# Patient Record
Sex: Female | Born: 1980 | Race: White | Hispanic: No | Smoking: Former smoker
Health system: Southern US, Community
[De-identification: ages and names within clinical notes are randomized; demographics above are authoritative.]

## PROBLEM LIST (undated history)

## (undated) ENCOUNTER — Inpatient Hospital Stay (HOSPITAL_COMMUNITY): Payer: Self-pay

## (undated) DIAGNOSIS — F32A Depression, unspecified: Secondary | ICD-10-CM

## (undated) DIAGNOSIS — Z9079 Acquired absence of other genital organ(s): Secondary | ICD-10-CM

## (undated) DIAGNOSIS — F329 Major depressive disorder, single episode, unspecified: Secondary | ICD-10-CM

## (undated) DIAGNOSIS — N979 Female infertility, unspecified: Secondary | ICD-10-CM

## (undated) DIAGNOSIS — E119 Type 2 diabetes mellitus without complications: Secondary | ICD-10-CM

## (undated) DIAGNOSIS — I10 Essential (primary) hypertension: Secondary | ICD-10-CM

## (undated) DIAGNOSIS — E282 Polycystic ovarian syndrome: Secondary | ICD-10-CM

## (undated) HISTORY — PX: BREAST SURGERY: SHX581

## (undated) HISTORY — DX: Essential (primary) hypertension: I10

## (undated) HISTORY — DX: Type 2 diabetes mellitus without complications: E11.9

## (undated) HISTORY — PX: APPENDECTOMY: SHX54

## (undated) HISTORY — DX: Acquired absence of other genital organ(s): Z90.79

---

## 2014-08-18 HISTORY — PX: GASTRIC BYPASS: SHX52

## 2017-03-09 ENCOUNTER — Other Ambulatory Visit (HOSPITAL_COMMUNITY)
Admission: RE | Admit: 2017-03-09 | Discharge: 2017-03-09 | Disposition: A | Discharge status: Home / Self Care | Payer: Medicaid Other | Source: Ambulatory Visit | Attending: Certified Nurse Midwife | Admitting: Certified Nurse Midwife

## 2017-03-09 ENCOUNTER — Ambulatory Visit (INDEPENDENT_AMBULATORY_CARE_PROVIDER_SITE_OTHER): Payer: Medicaid Other | Admitting: Certified Nurse Midwife

## 2017-03-09 ENCOUNTER — Encounter: Payer: Self-pay | Admitting: Certified Nurse Midwife

## 2017-03-09 VITALS — BP 113/65 | HR 78 | Ht 61.0 in | Wt 153.6 lb

## 2017-03-09 DIAGNOSIS — O24312 Unspecified pre-existing diabetes mellitus in pregnancy, second trimester: Secondary | ICD-10-CM | POA: Diagnosis not present

## 2017-03-09 DIAGNOSIS — O09812 Supervision of pregnancy resulting from assisted reproductive technology, second trimester: Secondary | ICD-10-CM

## 2017-03-09 DIAGNOSIS — O24319 Unspecified pre-existing diabetes mellitus in pregnancy, unspecified trimester: Secondary | ICD-10-CM | POA: Insufficient documentation

## 2017-03-09 DIAGNOSIS — O09811 Supervision of pregnancy resulting from assisted reproductive technology, first trimester: Secondary | ICD-10-CM | POA: Insufficient documentation

## 2017-03-09 DIAGNOSIS — O10919 Unspecified pre-existing hypertension complicating pregnancy, unspecified trimester: Secondary | ICD-10-CM

## 2017-03-09 DIAGNOSIS — O09519 Supervision of elderly primigravida, unspecified trimester: Secondary | ICD-10-CM

## 2017-03-09 DIAGNOSIS — O09511 Supervision of elderly primigravida, first trimester: Secondary | ICD-10-CM | POA: Insufficient documentation

## 2017-03-09 DIAGNOSIS — O30002 Twin pregnancy, unspecified number of placenta and unspecified number of amniotic sacs, second trimester: Secondary | ICD-10-CM

## 2017-03-09 DIAGNOSIS — O09512 Supervision of elderly primigravida, second trimester: Secondary | ICD-10-CM

## 2017-03-09 DIAGNOSIS — E282 Polycystic ovarian syndrome: Secondary | ICD-10-CM | POA: Diagnosis not present

## 2017-03-09 DIAGNOSIS — O99840 Bariatric surgery status complicating pregnancy, unspecified trimester: Secondary | ICD-10-CM | POA: Insufficient documentation

## 2017-03-09 DIAGNOSIS — O10912 Unspecified pre-existing hypertension complicating pregnancy, second trimester: Secondary | ICD-10-CM

## 2017-03-09 DIAGNOSIS — O30001 Twin pregnancy, unspecified number of placenta and unspecified number of amniotic sacs, first trimester: Secondary | ICD-10-CM

## 2017-03-09 DIAGNOSIS — O30009 Twin pregnancy, unspecified number of placenta and unspecified number of amniotic sacs, unspecified trimester: Secondary | ICD-10-CM | POA: Insufficient documentation

## 2017-03-09 DIAGNOSIS — O09819 Supervision of pregnancy resulting from assisted reproductive technology, unspecified trimester: Secondary | ICD-10-CM | POA: Insufficient documentation

## 2017-03-09 LAB — POCT URINALYSIS DIP (DEVICE)
Glucose, UA: NEGATIVE mg/dL
Nitrite: NEGATIVE
PH: 5.5 (ref 5.0–8.0)
Protein, ur: 30 mg/dL — AB
Urobilinogen, UA: 0.2 mg/dL (ref 0.0–1.0)

## 2017-03-09 LAB — CYSTIC FIBROSIS DIAGNOSTIC STUDY: INTERPRETATION-CFDNA: NEGATIVE

## 2017-03-09 NOTE — Progress Notes (Signed)
Subjective:  Gina Henderson is a 36 y.o. G1P0000 at 20w1dbeing seen today for ongoing prenatal care.  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  Patient reports no complaints.  Contractions: Not present. Vag. Bleeding: None.  Movement: Absent. Denies leaking of fluid.   The following portions of the patient's history were reviewed and updated as appropriate: allergies, current medications, past family history, past medical history, past social history, past surgical history and problem list. Problem list updated.  Objective:   Vitals:   03/09/17 1313 03/09/17 1319  BP: 113/65   Pulse: 78   Weight: 153 lb 9.6 oz (69.7 kg)   Height:  5' 1"  (1.549 m)    Fetal Status: Fetal Heart Rate (bpm): 139/154 Fundal Height: 18 cm Movement: Absent     General:  Alert, oriented and cooperative. Patient is in no acute distress.  Breast: bilateral breasts normal in appearance. No erythema, deformity, or nipple discharge. No palpable abnormal masses. No axillary lymphadenopathy.  Skin: Skin is warm and dry. No rash noted.   Cardiovascular: Normal heart rate noted  Respiratory: Normal respiratory effort, no problems with respiration noted  Abdomen: Soft, gravid, appropriate for gestational age. Pain/Pressure: Present     Pelvic: Vag. Bleeding: None  Normal introitus for age, no external lesions, mild vaginal discharge, mucosa pink and moist, no vaginal or cervical lesions, no vaginal atrophy, no friaility or hemorrhage, no  adnexal masses or tenderness Cervical exam deferred        Extremities: Normal range of motion.  Edema: None  Mental Status: Normal mood and affect. Normal behavior. Normal judgment and thought content.   Urinalysis:    Urine protein 30  Assessment and Plan:  Pregnancy: G1P0000 at 154w1d  1. Encounter for supervision of high risk primigravida of advanced maternal age Initial prenatal labs collected. Pap smear performed. Genetic counseling offered and patient wants testing. Orders placed for NIPS.   2. Pregnancy resulting from assisted reproductive technology in first trimester H/o left salpingectomy and obstruction of other fallopian tube. Received IVF with transfer of two embryos on May 11th, 2018. Requesting records from office.  - Hemoglobin A1c - Cystic Fibrosis Mutation 97 - Culture, OB Urine - Obstetric Panel, Including HIV - [place] OB DETAIL +14 WK; Future - Comp Met (CMET) - [place] Fetal Nuchal Translucency; Future  3. AMA (advanced maternal age) primigravida 357+first trimester Genetic counseling offered.  - Hemoglobin A1c - Cytology - PAP - Cystic Fibrosis Mutation 97 - Culture, OB Urine - Obstetric Panel, Including HIV - [place] OB DETAIL +14 WK; Future - Comp Met (CMET) - [place] Fetal Nuchal Translucency; Future  4. Chronic hypertension during pregnancy, antepartum Will check renal function and P/C ratio. Will hold off on initiating ASA in setting of gastric bypass. BPs well controlled currently. Not on medications.   5. Pregestational diabetes mellitus, modified White class B Pre-gestational DM that was controlled on medications. Patient currently no longer taking metformin due to intolerance. Collect A1c. Encouraged patient to record her CBGs daily x4. Hold off on nutritionist at this time.  6. PCOS (polycystic ovarian syndrome)  7.  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  8. Twin gestation  in first trimester, unspecified multiple gestation type Most likely di/di. Requesting records. [place] ordered.    Preterm labor symptoms and general obstetric precautions including but not limited to vaginal bleeding, contractions, leaking of fluid and fetal movement were reviewed in detail with the patient. Please refer to After Visit Summary for other counseling recommendations.  Return in about 4 weeks (around 04/06/2017) for ob visit.  Luiz Blare, DO [facility], [place] 03/09/2017, 2:29 PM

## 2017-03-10 ENCOUNTER — Ambulatory Visit: Payer: Medicaid Other

## 2017-03-10 DIAGNOSIS — Z0189 Encounter for other specified special examinations: Secondary | ICD-10-CM

## 2017-03-10 LAB — PROTEIN / CREATININE RATIO, URINE
Creatinine, Urine: 231 mg/dL
PROTEIN/CREAT RATIO: 127 mg/g{creat} (ref 0–200)
Protein, Ur: 29.3 mg/dL

## 2017-03-10 NOTE — Progress Notes (Signed)
Late entry; called patient on 03/09/17 to have her come in for NIPS. Patient stated that she would come back in on 7/23 or 7/24. Patient had no questions.Patient verbalized  Understanding.Patient presented to office on 7/24 to have labs drawn for [place]or[facility] . Patient tolerated well.

## 2017-03-11 LAB — CULTURE, OB URINE

## 2017-03-11 LAB — CYTOLOGY - PAP
Candida vaginitis: NEGATIVE
Chlamydia: NEGATIVE
DIAGNOSIS: NEGATIVE
HPV: NOT DETECTED
NEISSERIA GONORRHEA: NEGATIVE
Trichomonas: NEGATIVE

## 2017-03-11 LAB — URINE CULTURE, OB REFLEX

## 2017-03-12 ENCOUNTER — Other Ambulatory Visit: Payer: Self-pay

## 2017-03-12 DIAGNOSIS — O09511 Supervision of elderly primigravida, first trimester: Secondary | ICD-10-CM

## 2017-03-13 ENCOUNTER — Ambulatory Visit (HOSPITAL_COMMUNITY)
Admission: RE | Admit: 2017-03-13 | Discharge: 2017-03-13 | Disposition: A | Discharge status: Home / Self Care | Payer: Medicaid Other | Source: Ambulatory Visit | Attending: Certified Nurse Midwife | Admitting: Certified Nurse Midwife

## 2017-03-13 ENCOUNTER — Other Ambulatory Visit: Payer: Self-pay | Admitting: Certified Nurse Midwife

## 2017-03-13 ENCOUNTER — Ambulatory Visit (HOSPITAL_COMMUNITY): Admission: RE | Admit: 2017-03-13 | Payer: Medicaid Other | Source: Ambulatory Visit

## 2017-03-13 ENCOUNTER — Encounter (HOSPITAL_COMMUNITY): Payer: Self-pay

## 2017-03-13 ENCOUNTER — Encounter: Payer: Self-pay | Admitting: *Deleted

## 2017-03-13 DIAGNOSIS — O09511 Supervision of elderly primigravida, first trimester: Secondary | ICD-10-CM

## 2017-03-13 DIAGNOSIS — O10011 Pre-existing essential hypertension complicating pregnancy, first trimester: Secondary | ICD-10-CM | POA: Insufficient documentation

## 2017-03-13 DIAGNOSIS — O99841 Bariatric surgery status complicating pregnancy, first trimester: Secondary | ICD-10-CM | POA: Insufficient documentation

## 2017-03-13 DIAGNOSIS — O30041 Twin pregnancy, dichorionic/diamniotic, first trimester: Secondary | ICD-10-CM | POA: Diagnosis present

## 2017-03-13 DIAGNOSIS — O30001 Twin pregnancy, unspecified number of placenta and unspecified number of amniotic sacs, first trimester: Secondary | ICD-10-CM

## 2017-03-13 DIAGNOSIS — O09811 Supervision of pregnancy resulting from assisted reproductive technology, first trimester: Secondary | ICD-10-CM | POA: Insufficient documentation

## 2017-03-13 DIAGNOSIS — Z3682 Encounter for antenatal screening for nuchal translucency: Secondary | ICD-10-CM

## 2017-03-13 DIAGNOSIS — Z3A13 13 weeks gestation of pregnancy: Secondary | ICD-10-CM

## 2017-03-13 DIAGNOSIS — O09512 Supervision of elderly primigravida, second trimester: Secondary | ICD-10-CM

## 2017-03-13 DIAGNOSIS — O24111 Pre-existing diabetes mellitus, type 2, in pregnancy, first trimester: Secondary | ICD-10-CM | POA: Insufficient documentation

## 2017-03-13 NOTE — Progress Notes (Signed)
Genetic Counseling  High-Risk Gestation Note  Appointment Date:  03/13/2017 Referred By: Gina Henderson, Gina Henderson, CNM Date of Birth:  01/20/1981   Pregnancy History: G1P0000 Estimated Date of Delivery: 09/13/17 Estimated Gestational Age: 5134w5d Attending: Alpha GulaPaul Whitecar, MD  [name] was seen for genetic counseling because of a maternal age of 36 years old at delivery in a dichorionic/diamniotic twin gestation.      In summary:  Discussed AMA and associated risk for fetal aneuploidy  Discussed options for screening  First screen-declined  Quad screen- declined  [facility]  Ultrasound- performed today; see separate ultrasound report  Discussed diagnostic testing options  Amniocentesis- declined  Reviewed family history concerns  Discussed carrier screening options- [place] [place] within normal limits  CF  SMA  Hemoglobinopathies  The current pregnancy [facility] The patient reported that she declined preimplantation genetic screening and two embryos were transferred. She was counseled regarding maternal age and the association with risk for chromosome conditions due to nondisjunction with aging of the ova.   We reviewed chromosomes, nondisjunction, and the associated 1 in 8678 risk for fetal aneuploidy in either twin related to a maternal age of 36 years old at delivery.  She was counseled that the risk for aneuploidy decreases as gestational age increases, accounting for those pregnancies which spontaneously abort.  We specifically discussed Down syndrome (trisomy 621), trisomies 4213 and 1318, and sex chromosome aneuploidies (47,XXX and 47,XXY) including the common features and prognoses of each.   We reviewed available screening options including First Screen, Quad screen, noninvasive prenatal  screening (NIPS)/cell free DNA (cfDNA) screening, and detailed ultrasound.  She was counseled that screening tests are used to modify a patient's a priori risk for aneuploidy, typically based on age. This estimate provides a pregnancy specific risk assessment. We reviewed the benefits and limitations of each option. Specifically, we discussed the conditions for which each test screens, the detection rates, and false positive rates of each. She was also counseled regarding diagnostic testing via [facility] and amniocentesis. We reviewed the approximate 1 in 300-500 risk for complications from amniocentesis, including spontaneous pregnancy loss. We discussed the possible results that the tests might provide including: positive, negative, unanticipated, and no result.   [name] stated that NIPS was drawn through her OB provider on Tuesday, 03/10/17. These results are currently pending. Given this pending result, maternal serum screening (First trimester screening and Quad screening) were declined. The patient declined amniocentesis in pregnancy.   A nuchal translucency ultrasound was attempted today, but NT assessment was not able to be performed due to fetal position.  The report will be documented separately.  The patient would like to return for a detailed ultrasound at ~18+ weeks gestation.  This appointment was scheduled today. She understands that screening tests cannot rule out all birth defects or genetic syndromes. The patient was advised of this limitation and states she still does not want additional testing at this time.    Gina Henderson was provided with written information regarding cystic fibrosis (CF), spinal muscular atrophy (SMA) and hemoglobinopathies including the carrier frequency, availability of carrier screening and prenatal diagnosis if indicated.  In addition, we discussed that CF and hemoglobinopathies are routinely screened for as part of the Gina Henderson newborn screening panel. Medical  records received from Gina Henderson indicated that Ms. Henderson had expanded carrier screening ([facility] through  [employer]), which was within normal limits for the conditions screened, including SMA, CF, and hemoglobinopathies. See separate lab report in medical records scanned under Media for complete list of conditions screened. Thus, her risk to be a carrier for each of these conditions has been significantly reduced.   Both family histories were reviewed and found to be noncontributory for birth defects, intellectual disability, and known genetic conditions. Consanguinity was denied. Without further information regarding the provided family history, an accurate genetic risk cannot be calculated. Further genetic counseling is warranted if more information is obtained.  [name] denied exposure to environmental toxins or chemical agents. She denied the use of alcohol, tobacco or street drugs. She denied significant viral illnesses during the course of her pregnancy. Her medical and surgical histories were contributory for history of gastric bypass surgery.   I counseled [name] regarding the above risks and available options.  The approximate face-to-face time with the genetic counselor was 30 minutes.  Gina PlowmanKaren Glenville Espina, MS,  Certified [employer] 03/13/2017

## 2017-03-16 LAB — HEMOGLOBIN A1C
ESTIMATED AVERAGE GLUCOSE: 111 mg/dL
Hgb A1c MFr Bld: 5.5 % (ref 4.8–5.6)

## 2017-03-16 LAB — OBSTETRIC PANEL, INCLUDING HIV
ANTIBODY SCREEN: NEGATIVE
BASOS: 0 %
Basophils Absolute: 0 10*3/uL (ref 0.0–0.2)
EOS (ABSOLUTE): 0.1 10*3/uL (ref 0.0–0.4)
Eos: 0 %
HEMOGLOBIN: 10.6 g/dL — AB (ref 11.1–15.9)
HIV SCREEN 4TH GENERATION: NONREACTIVE
Hematocrit: 32.1 % — ABNORMAL LOW (ref 34.0–46.6)
Hepatitis B Surface Ag: NEGATIVE
IMMATURE GRANULOCYTES: 0 %
Immature Grans (Abs): 0 10*3/uL (ref 0.0–0.1)
LYMPHS ABS: 1.8 10*3/uL (ref 0.7–3.1)
Lymphs: 16 %
MCH: 27.4 pg (ref 26.6–33.0)
MCHC: 33 g/dL (ref 31.5–35.7)
MCV: 83 fL (ref 79–97)
MONOS ABS: 0.6 10*3/uL (ref 0.1–0.9)
Monocytes: 5 %
NEUTROS ABS: 9.1 10*3/uL — AB (ref 1.4–7.0)
NEUTROS PCT: 79 %
Platelets: 208 10*3/uL (ref 150–379)
RBC: 3.87 x10E6/uL (ref 3.77–5.28)
RDW: 16 % — ABNORMAL HIGH (ref 12.3–15.4)
RH TYPE: POSITIVE
RPR Ser Ql: NONREACTIVE
Rubella Antibodies, IGG: 8.26 index (ref >0.99)
WBC: 11.6 10*3/uL — AB (ref 3.4–10.8)

## 2017-03-16 LAB — CYSTIC FIBROSIS MUTATION 97: Interpretation: NOT DETECTED

## 2017-03-20 ENCOUNTER — Encounter: Payer: Self-pay | Admitting: *Deleted

## 2017-04-02 LAB — COMPREHENSIVE METABOLIC PANEL
A/G RATIO: 1.5 (ref 1.2–2.2)
ALBUMIN: 4 g/dL (ref 3.5–5.5)
ALT: 8 IU/L (ref 0–32)
AST: 14 IU/L (ref 0–40)
Alkaline Phosphatase: 56 IU/L (ref 39–117)
BUN / CREAT RATIO: 17 (ref 9–23)
BUN: 9 mg/dL (ref 6–20)
Bilirubin Total: <0.2 mg/dL (ref 0.0–1.2)
CALCIUM: 9.1 mg/dL (ref 8.7–10.2)
CO2: 16 mmol/L — AB (ref 20–29)
CREATININE: 0.53 mg/dL — AB (ref 0.57–1.00)
Chloride: 103 mmol/L (ref 96–106)
GFR, EST AFRICAN AMERICAN: 138 mL/min/{1.73_m2} (ref >59)
GFR, EST NON AFRICAN AMERICAN: 120 mL/min/{1.73_m2} (ref >59)
GLOBULIN, TOTAL: 2.6 g/dL (ref 1.5–4.5)
Glucose: 74 mg/dL (ref 65–99)
POTASSIUM: 4.3 mmol/L (ref 3.5–5.2)
SODIUM: 136 mmol/L (ref 134–144)
Total Protein: 6.6 g/dL (ref 6.0–8.5)

## 2017-04-02 LAB — TSH: TSH: 0.063 u[IU]/mL — ABNORMAL LOW (ref 0.450–4.500)

## 2017-04-02 LAB — SPECIMEN STATUS REPORT

## 2017-04-03 ENCOUNTER — Encounter: Payer: Self-pay | Admitting: Certified Nurse Midwife

## 2017-04-03 DIAGNOSIS — R7989 Other specified abnormal findings of blood chemistry: Secondary | ICD-10-CM | POA: Insufficient documentation

## 2017-04-06 ENCOUNTER — Encounter: Payer: Self-pay | Admitting: General Practice

## 2017-04-06 ENCOUNTER — Ambulatory Visit (INDEPENDENT_AMBULATORY_CARE_PROVIDER_SITE_OTHER): Payer: Medicaid Other | Admitting: Family Medicine

## 2017-04-06 VITALS — BP 118/78 | HR 84 | Wt 157.0 lb

## 2017-04-06 DIAGNOSIS — O24312 Unspecified pre-existing diabetes mellitus in pregnancy, second trimester: Secondary | ICD-10-CM | POA: Diagnosis not present

## 2017-04-06 DIAGNOSIS — O24319 Unspecified pre-existing diabetes mellitus in pregnancy, unspecified trimester: Secondary | ICD-10-CM

## 2017-04-06 DIAGNOSIS — O10912 Unspecified pre-existing hypertension complicating pregnancy, second trimester: Secondary | ICD-10-CM | POA: Diagnosis not present

## 2017-04-06 DIAGNOSIS — R7989 Other specified abnormal findings of blood chemistry: Secondary | ICD-10-CM

## 2017-04-06 DIAGNOSIS — O09511 Supervision of elderly primigravida, first trimester: Secondary | ICD-10-CM

## 2017-04-06 DIAGNOSIS — Z9884 Bariatric surgery status: Secondary | ICD-10-CM

## 2017-04-06 DIAGNOSIS — O09812 Supervision of pregnancy resulting from assisted reproductive technology, second trimester: Secondary | ICD-10-CM

## 2017-04-06 DIAGNOSIS — O30001 Twin pregnancy, unspecified number of placenta and unspecified number of amniotic sacs, first trimester: Secondary | ICD-10-CM

## 2017-04-06 DIAGNOSIS — O09512 Supervision of elderly primigravida, second trimester: Secondary | ICD-10-CM | POA: Diagnosis present

## 2017-04-06 DIAGNOSIS — O10919 Unspecified pre-existing hypertension complicating pregnancy, unspecified trimester: Secondary | ICD-10-CM

## 2017-04-06 DIAGNOSIS — O09811 Supervision of pregnancy resulting from assisted reproductive technology, first trimester: Secondary | ICD-10-CM

## 2017-04-06 DIAGNOSIS — O09519 Supervision of elderly primigravida, unspecified trimester: Secondary | ICD-10-CM

## 2017-04-06 NOTE — Progress Notes (Signed)
   PRENATAL VISIT NOTE  Subjective:  Gina Henderson is a 36 y.o. G1P0000 at [redacted]w[redacted]d being seen today for ongoing prenatal care.  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  Patient reports round ligament pain.  Contractions: Not present. Vag. Bleeding: None.  Movement: Absent. Denies leaking of fluid.   DM: Patient diet controlled. Has not been on medication after gastric bypass. HgA1c 5.5. Fasting: 85-90 1hr PP: < 140   The following portions of the patient's history were reviewed and updated as appropriate: allergies, current medications, past family history, past medical history, past social history, past surgical history and problem list. Problem list updated.  Objective:   Vitals:   04/06/17 1257  BP: 118/78  Pulse: 84  Weight: 157 lb (71.2 kg)    Fetal Status: Fetal Heart Rate (bpm): 155/148   Movement: Absent     General:  Alert, oriented and cooperative. Patient is in no acute distress.  Skin: Skin is warm and dry. No rash noted.   Cardiovascular: Normal heart rate noted  Respiratory: Normal respiratory effort, no problems with respiration noted  Abdomen: Soft, gravid, appropriate for gestational age.  Pain/Pressure: Present     Pelvic: Cervical exam deferred        Extremities: Normal range of motion.  Edema: None  Mental Status:  Normal mood and affect. Normal behavior. Normal judgment and thought content.    Assessment and Plan:  Pregnancy: G1P0000 at [redacted]w[redacted]d  1. Encounter for supervision of high risk primigravida of advanced maternal age   2. Twin gestation in first trimester, unspecified multiple gestation type [place] next week  3. Chronic hypertension during pregnancy, antepartum Not on BP medication  4. AMA (advanced maternal age) primigravida 35+, first trimester No additional testing needed  5. Pregnancy resulting from assisted reproductive technology in first trimester  6. Pregestational diabetes mellitus, modified White class B Diet controlled.  7. H/O gastric bypass Sleeve  8. Abnormal thyroid blood test Check thyroid levels - T3, free - T4, free  Term labor symptoms and general obstetric precautions including but not limited to vaginal bleeding, contractions, leaking of fluid and fetal movement were reviewed in detail with the patient. Please refer to After Visit Summary for other counseling recommendations.  No Follow-up on file.   Levie Heritage, DO

## 2017-04-07 LAB — T3, FREE: T3 FREE: 2.6 pg/mL (ref 2.0–4.4)

## 2017-04-07 LAB — T4, FREE: FREE T4: 1.25 ng/dL (ref 0.82–1.77)

## 2017-04-13 ENCOUNTER — Other Ambulatory Visit: Payer: Self-pay | Admitting: Certified Nurse Midwife

## 2017-04-13 ENCOUNTER — Encounter: Payer: Self-pay | Admitting: Family Medicine

## 2017-04-13 ENCOUNTER — Encounter (HOSPITAL_COMMUNITY): Payer: Self-pay

## 2017-04-13 ENCOUNTER — Ambulatory Visit (HOSPITAL_COMMUNITY)
Admission: RE | Admit: 2017-04-13 | Discharge: 2017-04-13 | Disposition: A | Discharge status: Home / Self Care | Payer: Medicaid Other | Source: Ambulatory Visit | Attending: Certified Nurse Midwife | Admitting: Certified Nurse Midwife

## 2017-04-13 ENCOUNTER — Ambulatory Visit (HOSPITAL_COMMUNITY): Payer: Medicaid Other

## 2017-04-13 DIAGNOSIS — O09519 Supervision of elderly primigravida, unspecified trimester: Secondary | ICD-10-CM

## 2017-04-13 DIAGNOSIS — O30042 Twin pregnancy, dichorionic/diamniotic, second trimester: Secondary | ICD-10-CM | POA: Diagnosis present

## 2017-04-13 DIAGNOSIS — O99842 Bariatric surgery status complicating pregnancy, second trimester: Secondary | ICD-10-CM | POA: Diagnosis not present

## 2017-04-13 DIAGNOSIS — O09512 Supervision of elderly primigravida, second trimester: Secondary | ICD-10-CM | POA: Diagnosis not present

## 2017-04-13 DIAGNOSIS — O09812 Supervision of pregnancy resulting from assisted reproductive technology, second trimester: Secondary | ICD-10-CM | POA: Insufficient documentation

## 2017-04-13 DIAGNOSIS — Z3A18 18 weeks gestation of pregnancy: Secondary | ICD-10-CM

## 2017-04-13 DIAGNOSIS — O09811 Supervision of pregnancy resulting from assisted reproductive technology, first trimester: Secondary | ICD-10-CM

## 2017-04-13 DIAGNOSIS — O24912 Unspecified diabetes mellitus in pregnancy, second trimester: Secondary | ICD-10-CM | POA: Diagnosis not present

## 2017-04-13 DIAGNOSIS — O10012 Pre-existing essential hypertension complicating pregnancy, second trimester: Secondary | ICD-10-CM

## 2017-04-13 DIAGNOSIS — O09511 Supervision of elderly primigravida, first trimester: Secondary | ICD-10-CM

## 2017-04-14 ENCOUNTER — Telehealth: Payer: Self-pay | Admitting: General Practice

## 2017-04-14 ENCOUNTER — Other Ambulatory Visit (HOSPITAL_COMMUNITY): Payer: Self-pay | Admitting: *Deleted

## 2017-04-14 DIAGNOSIS — O30042 Twin pregnancy, dichorionic/diamniotic, second trimester: Secondary | ICD-10-CM

## 2017-04-14 NOTE — Addendum Note (Signed)
Encounter addended by: Donette Larry, CNM on: 04/14/2017  9:49 AM<BR>    Actions taken: Visit diagnoses modified, Problem List modified

## 2017-04-14 NOTE — Telephone Encounter (Signed)
Called patient and left message on VM of appointment change on 05/04/17 at 10:40am.  Asked patient to give our office a call if unable to keep appointment.

## 2017-04-27 ENCOUNTER — Inpatient Hospital Stay (HOSPITAL_COMMUNITY)
Admission: AD | Admit: 2017-04-27 | Discharge: 2017-04-27 | Disposition: A | Discharge status: Home / Self Care | Payer: Medicaid Other | Source: Ambulatory Visit | Attending: Obstetrics & Gynecology | Admitting: Obstetrics & Gynecology

## 2017-04-27 ENCOUNTER — Other Ambulatory Visit (HOSPITAL_COMMUNITY): Payer: Medicaid Other

## 2017-04-27 ENCOUNTER — Inpatient Hospital Stay (HOSPITAL_BASED_OUTPATIENT_CLINIC_OR_DEPARTMENT_OTHER)
Admit: 2017-04-27 | Discharge: 2017-04-27 | Disposition: A | Discharge status: Home / Self Care | Payer: Medicaid Other | Attending: Student | Admitting: Student

## 2017-04-27 ENCOUNTER — Encounter (HOSPITAL_COMMUNITY): Payer: Self-pay | Admitting: *Deleted

## 2017-04-27 DIAGNOSIS — Z87891 Personal history of nicotine dependence: Secondary | ICD-10-CM | POA: Insufficient documentation

## 2017-04-27 DIAGNOSIS — I8392 Asymptomatic varicose veins of left lower extremity: Secondary | ICD-10-CM | POA: Diagnosis not present

## 2017-04-27 DIAGNOSIS — M79662 Pain in left lower leg: Secondary | ICD-10-CM | POA: Diagnosis present

## 2017-04-27 DIAGNOSIS — O9989 Other specified diseases and conditions complicating pregnancy, childbirth and the puerperium: Secondary | ICD-10-CM | POA: Diagnosis not present

## 2017-04-27 DIAGNOSIS — Z3A20 20 weeks gestation of pregnancy: Secondary | ICD-10-CM | POA: Diagnosis not present

## 2017-04-27 DIAGNOSIS — O2202 Varicose veins of lower extremity in pregnancy, second trimester: Secondary | ICD-10-CM | POA: Insufficient documentation

## 2017-04-27 DIAGNOSIS — M7989 Other specified soft tissue disorders: Secondary | ICD-10-CM | POA: Diagnosis not present

## 2017-04-27 DIAGNOSIS — O99842 Bariatric surgery status complicating pregnancy, second trimester: Secondary | ICD-10-CM | POA: Diagnosis not present

## 2017-04-27 HISTORY — DX: Polycystic ovarian syndrome: E28.2

## 2017-04-27 HISTORY — DX: Depression, unspecified: F32.A

## 2017-04-27 HISTORY — DX: Female infertility, unspecified: N97.9

## 2017-04-27 HISTORY — DX: Major depressive disorder, single episode, unspecified: F32.9

## 2017-04-27 LAB — URINALYSIS, ROUTINE W REFLEX MICROSCOPIC
BILIRUBIN URINE: NEGATIVE
Glucose, UA: NEGATIVE mg/dL
Hgb urine dipstick: NEGATIVE
KETONES UR: NEGATIVE mg/dL
Nitrite: NEGATIVE
PROTEIN: NEGATIVE mg/dL
SPECIFIC GRAVITY, URINE: 1.013 (ref 1.005–1.030)
pH: 5 (ref 5.0–8.0)

## 2017-04-27 NOTE — MAU Provider Note (Signed)
History     CSN: [id number]  Arrival date and time: 04/27/17 [id number]  First Provider Initiated Contact with Patient 04/27/17 1012      Chief Complaint  Patient presents with  . Leg Pain  . Varicose Veins   HPI Gina Henderson is a 36 y.o. G1P0000 at 8523w2d with di/di twins who presents with leg pain r/t varicose veins. Reports her varicose veins have worsened during this pregnancy. Reports large painful area in back of left leg just above her knee. Rates pain 8/10 & describes as sore. Has not treated pain. Denies chest pain, cough, or SOB.  OB History    Gravida Para Term Preterm AB Living   1   0 0 0     SAB TAB Ectopic Multiple Live Births   0              Past Medical History:  Diagnosis Date  . Depression   . Diabetes mellitus without complication (HCC)    off meds since wt loss  . History of unilateral salpingectomy, left   . Hypertension    off meds since wt loss  . Infertility, female    PCOS  . PCOS (polycystic ovarian syndrome)     Past Surgical History:  Procedure Laterality Date  . APPENDECTOMY    . BREAST SURGERY    . GASTRIC BYPASS  2016    Family History  Problem Relation Age of Onset  . Diabetes Father   . Kidney disease Father   . Hypertension Father   . Hearing loss Father     Social History  Substance Use Topics  . Smoking status: Former Smoker    Types: Cigarettes  . Smokeless tobacco: Never Used     Comment: quit 2016  . Alcohol use No    Allergies: No Known Allergies  Prescriptions Prior to Admission  Medication Sig Dispense Refill Last Dose  . Prenatal Multivit-Min-Fe-FA (PRENATAL VITAMINS PO) Take by mouth.   Taking    Review of Systems  Constitutional: Negative.   Respiratory: Negative.   Cardiovascular: Positive for leg swelling. Negative for chest pain and palpitations.  Gastrointestinal: Negative.   Genitourinary: Negative.    Physical Exam   Blood pressure 119/82, pulse 85, temperature 98 F (36.7 C), temperature  source Oral, resp. rate 16, last menstrual period 11/30/2016, SpO2 99 %.  Physical Exam  Nursing note and vitals reviewed. Constitutional: She is oriented to person, place, and time. She appears well-developed and well-nourished. She appears distressed.  HENT:  Head: Normocephalic and atraumatic.  Eyes: Conjunctivae are normal. Right eye exhibits no discharge. Left eye exhibits no discharge. No scleral icterus.  Neck: Normal range of motion.  Cardiovascular: Normal rate, regular rhythm, normal heart sounds and intact distal pulses.   No murmur heard. Pulses:      Dorsalis pedis pulses are 2+ on the right side, and 2+ on the left side.  Respiratory: Effort normal and breath sounds normal. No respiratory distress. She has no wheezes.  GI: Soft. There is no tenderness.  Musculoskeletal:       Left lower leg: She exhibits tenderness.       Legs: Neurological: She is alert and oriented to person, place, and time.  Skin: Skin is warm and dry. She is not diaphoretic.  Psychiatric: She has a normal mood and affect. Her behavior is normal. Judgment and thought content normal.    MAU Course  Procedures Results for orders placed or performed during the hospital encounter of  04/27/17 (from the past 48 hour(s))  Urinalysis, Routine w reflex microscopic     Status: Abnormal   Collection Time: 04/27/17  9:36 AM  Result Value Ref Range   Color, Urine YELLOW YELLOW   APPearance CLOUDY (A) CLEAR   Specific Gravity, Urine 1.013 1.005 - 1.030   pH 5.0 5.0 - 8.0   Glucose, UA NEGATIVE NEGATIVE mg/dL   Hgb urine dipstick NEGATIVE NEGATIVE   Bilirubin Urine NEGATIVE NEGATIVE   Ketones, ur NEGATIVE NEGATIVE mg/dL   Protein, ur NEGATIVE NEGATIVE mg/dL   Nitrite NEGATIVE NEGATIVE   Leukocytes, UA LARGE (A) NEGATIVE   RBC / HPF 0-5 0 - 5 RBC/hpf   WBC, UA 6-30 0 - 5 WBC/hpf   Bacteria, UA RARE (A) NONE SEEN   Squamous Epithelial / LPF 6-30 (A) NONE SEEN   Mucus PRESENT    Hyaline Casts, UA PRESENT     No results found.  MDM FHT 159/145 VSS, NAD Venous doppler study; no evidence of DVT  Assessment and Plan  A: 1. Varicose veins of left lower extremity    P; Discharge home Rx maternity full length compression hose, 30 strength  Discussed reasons to return to MAU Keep f/u with ob  Judeth Horn 04/27/2017, 10:10 AM

## 2017-04-27 NOTE — MAU Note (Signed)
Pt has varicose veins on L leg, are very painful & swollen, have become worse since Thursday.  Pain goes all the way up from her ankle to her groin area.  C/O abd pain that has been ongoing, denies bleeding or LOF.  Pt very SOB in triage, subsided some with sitting.

## 2017-04-27 NOTE — Discharge Instructions (Signed)
Varicose Veins Varicose veins are veins that have become enlarged and twisted. They are usually seen in the legs but can occur in other parts of the body as well. What are the causes? This condition is the result of valves in the veins not working properly. Valves in the veins help to return blood from the leg to the heart. If these valves are damaged, blood flows backward and backs up into the veins in the leg near the skin. This causes the veins to become larger. What increases the risk? People who are on their feet a lot, who are pregnant, or who are overweight are more likely to develop varicose veins. What are the signs or symptoms?  Bulging, twisted-appearing, bluish veins, most commonly found on the legs.  Leg pain or a feeling of heaviness. These symptoms may be worse at the end of the day.  Leg swelling.  Changes in skin color. How is this diagnosed? A health care provider can usually diagnose varicose veins by examining your legs. Your health care provider may also recommend an ultrasound of your leg veins. How is this treated? Most varicose veins can be treated at home.However, other treatments are available for people who have persistent symptoms or want to improve the cosmetic appearance of the varicose veins. These treatment options include:  Sclerotherapy. A solution is injected into the vein to close it off.  Laser treatment. A laser is used to heat the vein to close it off.  Radiofrequency vein ablation. An electrical current produced by radio waves is used to close off the vein.  Phlebectomy. The vein is surgically removed through small incisions made over the varicose vein.  Vein ligation and stripping. The vein is surgically removed through incisions made over the varicose vein after the vein has been tied (ligated). Follow these instructions at home:   Do not stand or sit in one position for long periods of time. Do not sit with your legs crossed. Rest with your  legs raised during the day.  Wear compression stockings as directed by your health care provider. These stockings help to prevent blood clots and reduce swelling in your legs.  Do not wear other tight, encircling garments around your legs, pelvis, or waist.  Walk as much as possible to increase blood flow.  Raise the foot of your bed at night with 2-inch blocks.  If you get a cut in the skin over the vein and the vein bleeds, lie down with your leg raised and press on it with a clean cloth until the bleeding stops. Then place a bandage (dressing) on the cut. See your health care provider if it continues to bleed. Contact a health care provider if:  The skin around your ankle starts to break down.  You have pain, redness, tenderness, or hard swelling in your leg over a vein.  You are uncomfortable because of leg pain. This information is not intended to replace advice given to you by your health care provider. Make sure you discuss any questions you have with your health care provider. Document Released: 05/14/2005 Document Revised: 01/10/2016 Document Reviewed: 02/05/2016 Elsevier Interactive Patient Education  2017 [employer]  

## 2017-04-27 NOTE — Progress Notes (Signed)
*  PRELIMINARY RESULTS* Vascular Ultrasound Left lower extremity venous duplex has been completed.  Preliminary findings: no evidence of DVT. Superficial thrombosis noted in varicose veins of the left posterior leg.   Gave results to [place], RN and provider.   Farrel DemarkJill Eunice, RDMS, RVT  04/27/2017, 11:37 AM

## 2017-05-04 ENCOUNTER — Ambulatory Visit (INDEPENDENT_AMBULATORY_CARE_PROVIDER_SITE_OTHER): Payer: Medicaid Other | Admitting: Advanced Practice Midwife

## 2017-05-04 ENCOUNTER — Encounter: Payer: Medicaid Other | Admitting: Advanced Practice Midwife

## 2017-05-04 ENCOUNTER — Encounter: Payer: Self-pay | Admitting: Advanced Practice Midwife

## 2017-05-04 VITALS — BP 105/52 | HR 105 | Wt 166.6 lb

## 2017-05-04 DIAGNOSIS — Z23 Encounter for immunization: Secondary | ICD-10-CM | POA: Diagnosis present

## 2017-05-04 DIAGNOSIS — O30042 Twin pregnancy, dichorionic/diamniotic, second trimester: Secondary | ICD-10-CM

## 2017-05-04 DIAGNOSIS — R946 Abnormal results of thyroid function studies: Secondary | ICD-10-CM

## 2017-05-04 DIAGNOSIS — O24312 Unspecified pre-existing diabetes mellitus in pregnancy, second trimester: Secondary | ICD-10-CM | POA: Diagnosis not present

## 2017-05-04 DIAGNOSIS — O10919 Unspecified pre-existing hypertension complicating pregnancy, unspecified trimester: Secondary | ICD-10-CM

## 2017-05-04 DIAGNOSIS — O09519 Supervision of elderly primigravida, unspecified trimester: Secondary | ICD-10-CM

## 2017-05-04 DIAGNOSIS — O09512 Supervision of elderly primigravida, second trimester: Secondary | ICD-10-CM | POA: Diagnosis not present

## 2017-05-04 DIAGNOSIS — O24319 Unspecified pre-existing diabetes mellitus in pregnancy, unspecified trimester: Secondary | ICD-10-CM

## 2017-05-04 DIAGNOSIS — R7989 Other specified abnormal findings of blood chemistry: Secondary | ICD-10-CM

## 2017-05-04 DIAGNOSIS — O10912 Unspecified pre-existing hypertension complicating pregnancy, second trimester: Secondary | ICD-10-CM | POA: Diagnosis not present

## 2017-05-04 NOTE — Progress Notes (Signed)
   PRENATAL VISIT NOTE  Subjective:  Gina Henderson is a 36 y.o. G1P0000 at [redacted]w[redacted]d being seen today for ongoing prenatal care.  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  Patient reports trouble sleeping, anxiety, worried about babies.  Contractions: Not present. Vag. Bleeding: None.  Movement: Present. Denies leaking of fluid.   The following portions of the patient's history were reviewed and updated as appropriate: allergies, current medications, past family history, past medical history, past social history, past surgical history and problem list. Problem list updated.  Objective:   Vitals:   05/04/17 1057  BP: (!) 105/52  Pulse: (!) 105  Weight: 166 lb 9.6 oz (75.6 kg)    Fetal Status: Fetal Heart Rate (bpm): 143/156   Movement: Present     General:  Alert, oriented and cooperative. Patient is in no acute distress.  Skin: Skin is warm and dry. No rash noted.   Cardiovascular: Normal heart rate noted  Respiratory: Normal respiratory effort, no problems with respiration noted  Abdomen: Soft, gravid, appropriate for gestational age.  Pain/Pressure: Present     Pelvic: Cervical exam deferred        Extremities: Normal range of motion.  Edema: Trace  Mental Status:  Normal mood and affect. Normal behavior. Normal judgment and thought content.   Assessment and Plan:  Pregnancy: G1P0000 at [redacted]w[redacted]d  1. Encounter for  supervision of high risk primigravida of advanced maternal age  - Flu Vaccine QUAD 36+ mos IM  2. Dichorionic diamniotic twin pregnancy in second trimester     Has followup US scheduled I reviewed her last one. She was concerned that they could not see things well. Reassured they will try on next one  3. Pregestational diabetes mellitus, modified White class B States did not know she was supposed to write down her sugars.  Only checks twice a day. States FBS 85-90, her 1 hour is 140 and 2 hour is "lower" Encouraged to write down.  Recommendations reviewed  4. Chronic hypertension during pregnancy, antepartum   Well controlled  5. Abnormal thyroid blood test Reviewed normal T3 and 4  Preterm labor symptoms and general obstetric precautions including but not limited to vaginal bleeding, contractions, leaking of fluid and fetal movement were reviewed in detail with the patient. Please refer to After Visit Summary for other counseling recommendations.  Return in about 3 weeks (around 05/25/2017).   Wynelle Bourgeois, CNM

## 2017-05-04 NOTE — Patient Instructions (Signed)

## 2017-05-12 ENCOUNTER — Other Ambulatory Visit: Payer: Self-pay | Admitting: Family Medicine

## 2017-05-12 ENCOUNTER — Other Ambulatory Visit (HOSPITAL_COMMUNITY): Payer: Self-pay | Admitting: Obstetrics and Gynecology

## 2017-05-12 ENCOUNTER — Ambulatory Visit (HOSPITAL_COMMUNITY)
Admission: RE | Admit: 2017-05-12 | Discharge: 2017-05-12 | Disposition: A | Discharge status: Home / Self Care | Payer: Medicaid Other | Source: Ambulatory Visit | Attending: Certified Nurse Midwife | Admitting: Certified Nurse Midwife

## 2017-05-12 ENCOUNTER — Other Ambulatory Visit (HOSPITAL_COMMUNITY): Payer: Self-pay | Admitting: Maternal and Fetal Medicine

## 2017-05-12 ENCOUNTER — Inpatient Hospital Stay (HOSPITAL_COMMUNITY): Admission: RE | Admit: 2017-05-12 | Payer: Medicaid Other | Source: Ambulatory Visit

## 2017-05-12 ENCOUNTER — Encounter (HOSPITAL_COMMUNITY): Payer: Self-pay

## 2017-05-12 DIAGNOSIS — O99842 Bariatric surgery status complicating pregnancy, second trimester: Secondary | ICD-10-CM | POA: Insufficient documentation

## 2017-05-12 DIAGNOSIS — O99840 Bariatric surgery status complicating pregnancy, unspecified trimester: Secondary | ICD-10-CM

## 2017-05-12 DIAGNOSIS — O30042 Twin pregnancy, dichorionic/diamniotic, second trimester: Secondary | ICD-10-CM

## 2017-05-12 DIAGNOSIS — O162 Unspecified maternal hypertension, second trimester: Secondary | ICD-10-CM

## 2017-05-12 DIAGNOSIS — O09512 Supervision of elderly primigravida, second trimester: Secondary | ICD-10-CM

## 2017-05-12 DIAGNOSIS — O09812 Supervision of pregnancy resulting from assisted reproductive technology, second trimester: Secondary | ICD-10-CM | POA: Insufficient documentation

## 2017-05-12 DIAGNOSIS — O24119 Pre-existing diabetes mellitus, type 2, in pregnancy, unspecified trimester: Secondary | ICD-10-CM

## 2017-05-12 DIAGNOSIS — O1002 Pre-existing essential hypertension complicating childbirth: Secondary | ICD-10-CM | POA: Insufficient documentation

## 2017-05-12 DIAGNOSIS — O09819 Supervision of pregnancy resulting from assisted reproductive technology, unspecified trimester: Secondary | ICD-10-CM

## 2017-05-12 DIAGNOSIS — O3432 Maternal care for cervical incompetence, second trimester: Secondary | ICD-10-CM

## 2017-05-12 DIAGNOSIS — Z3A22 22 weeks gestation of pregnancy: Secondary | ICD-10-CM

## 2017-05-12 DIAGNOSIS — O24319 Unspecified pre-existing diabetes mellitus in pregnancy, unspecified trimester: Secondary | ICD-10-CM

## 2017-05-12 DIAGNOSIS — O26872 Cervical shortening, second trimester: Secondary | ICD-10-CM

## 2017-05-12 DIAGNOSIS — Z3A23 23 weeks gestation of pregnancy: Secondary | ICD-10-CM

## 2017-05-12 DIAGNOSIS — O24112 Pre-existing diabetes mellitus, type 2, in pregnancy, second trimester: Secondary | ICD-10-CM | POA: Insufficient documentation

## 2017-05-12 DIAGNOSIS — O09892 Supervision of other high risk pregnancies, second trimester: Secondary | ICD-10-CM | POA: Insufficient documentation

## 2017-05-12 DIAGNOSIS — Z3689 Encounter for other specified antenatal screening: Secondary | ICD-10-CM | POA: Insufficient documentation

## 2017-05-12 DIAGNOSIS — O09519 Supervision of elderly primigravida, unspecified trimester: Secondary | ICD-10-CM

## 2017-05-12 DIAGNOSIS — O09811 Supervision of pregnancy resulting from assisted reproductive technology, first trimester: Secondary | ICD-10-CM

## 2017-05-12 MED ORDER — BETAMETHASONE SOD PHOS & ACET 6 (3-3) MG/ML IJ SUSP
12.0000 mg | Freq: Once | INTRAMUSCULAR | Status: AC
  Administered 2017-05-12: 12 mg via INTRAMUSCULAR
  Filled 2017-05-12: qty 2

## 2017-05-12 MED ORDER — PROGESTERONE MICRONIZED 200 MG PO CAPS: ORAL_CAPSULE | ORAL | 2 refills | Status: AC

## 2017-05-12 NOTE — Progress Notes (Signed)
Called by MFM - has shortened cervix. Pt at [redacted]w[redacted]d. I discussed pt with neonatology - they would not resuscitate until 23 weeks. Will give BMZ outpatient (in MFM) and start prometrium. Recheck cervical length at 23 weeks. Pt to go to MAU with any change.  Levie Heritage, DO 5:10 PM 05/12/2017

## 2017-05-13 ENCOUNTER — Ambulatory Visit (HOSPITAL_COMMUNITY)
Admission: RE | Admit: 2017-05-13 | Discharge: 2017-05-13 | Disposition: A | Discharge status: Home / Self Care | Payer: Medicaid Other | Source: Ambulatory Visit | Attending: Family Medicine | Admitting: Family Medicine

## 2017-05-13 DIAGNOSIS — O09812 Supervision of pregnancy resulting from assisted reproductive technology, second trimester: Secondary | ICD-10-CM | POA: Insufficient documentation

## 2017-05-13 DIAGNOSIS — O3432 Maternal care for cervical incompetence, second trimester: Secondary | ICD-10-CM | POA: Diagnosis not present

## 2017-05-13 DIAGNOSIS — Z3A22 22 weeks gestation of pregnancy: Secondary | ICD-10-CM | POA: Insufficient documentation

## 2017-05-13 DIAGNOSIS — O24112 Pre-existing diabetes mellitus, type 2, in pregnancy, second trimester: Secondary | ICD-10-CM | POA: Insufficient documentation

## 2017-05-13 DIAGNOSIS — O99842 Bariatric surgery status complicating pregnancy, second trimester: Secondary | ICD-10-CM | POA: Diagnosis not present

## 2017-05-13 DIAGNOSIS — O10012 Pre-existing essential hypertension complicating pregnancy, second trimester: Secondary | ICD-10-CM | POA: Insufficient documentation

## 2017-05-13 DIAGNOSIS — O30042 Twin pregnancy, dichorionic/diamniotic, second trimester: Secondary | ICD-10-CM | POA: Diagnosis not present

## 2017-05-13 DIAGNOSIS — O09512 Supervision of elderly primigravida, second trimester: Secondary | ICD-10-CM | POA: Diagnosis not present

## 2017-05-13 MED ORDER — BETAMETHASONE SOD PHOS & ACET 6 (3-3) MG/ML IJ SUSP
12.0000 mg | Freq: Once | INTRAMUSCULAR | Status: AC
  Administered 2017-05-13: 12 mg via INTRAMUSCULAR
  Filled 2017-05-13: qty 2

## 2017-05-19 ENCOUNTER — Encounter (HOSPITAL_COMMUNITY): Payer: Self-pay | Admitting: *Deleted

## 2017-05-19 ENCOUNTER — Inpatient Hospital Stay (HOSPITAL_COMMUNITY)
Admission: AD | Admit: 2017-05-19 | Discharge: 2017-05-19 | Disposition: A | Discharge status: Home / Self Care | Payer: Medicaid Other | Source: Ambulatory Visit | Attending: Family Medicine | Admitting: Family Medicine

## 2017-05-19 ENCOUNTER — Ambulatory Visit (HOSPITAL_COMMUNITY)
Admission: RE | Admit: 2017-05-19 | Discharge: 2017-05-19 | Disposition: A | Discharge status: Home / Self Care | Payer: Medicaid Other | Source: Ambulatory Visit | Attending: Family Medicine | Admitting: Family Medicine

## 2017-05-19 ENCOUNTER — Encounter (HOSPITAL_COMMUNITY): Payer: Self-pay

## 2017-05-19 ENCOUNTER — Other Ambulatory Visit (HOSPITAL_COMMUNITY): Payer: Self-pay | Admitting: Maternal and Fetal Medicine

## 2017-05-19 DIAGNOSIS — Z87891 Personal history of nicotine dependence: Secondary | ICD-10-CM | POA: Insufficient documentation

## 2017-05-19 DIAGNOSIS — Z822 Family history of deafness and hearing loss: Secondary | ICD-10-CM | POA: Diagnosis not present

## 2017-05-19 DIAGNOSIS — O24319 Unspecified pre-existing diabetes mellitus in pregnancy, unspecified trimester: Secondary | ICD-10-CM

## 2017-05-19 DIAGNOSIS — O09512 Supervision of elderly primigravida, second trimester: Secondary | ICD-10-CM

## 2017-05-19 DIAGNOSIS — O24112 Pre-existing diabetes mellitus, type 2, in pregnancy, second trimester: Secondary | ICD-10-CM

## 2017-05-19 DIAGNOSIS — Z833 Family history of diabetes mellitus: Secondary | ICD-10-CM | POA: Diagnosis not present

## 2017-05-19 DIAGNOSIS — Z3686 Encounter for antenatal screening for cervical length: Secondary | ICD-10-CM

## 2017-05-19 DIAGNOSIS — Z3A23 23 weeks gestation of pregnancy: Secondary | ICD-10-CM | POA: Insufficient documentation

## 2017-05-19 DIAGNOSIS — O30042 Twin pregnancy, dichorionic/diamniotic, second trimester: Secondary | ICD-10-CM | POA: Insufficient documentation

## 2017-05-19 DIAGNOSIS — O26872 Cervical shortening, second trimester: Secondary | ICD-10-CM

## 2017-05-19 DIAGNOSIS — Z841 Family history of disorders of kidney and ureter: Secondary | ICD-10-CM | POA: Diagnosis not present

## 2017-05-19 DIAGNOSIS — O3432 Maternal care for cervical incompetence, second trimester: Secondary | ICD-10-CM | POA: Insufficient documentation

## 2017-05-19 DIAGNOSIS — O09519 Supervision of elderly primigravida, unspecified trimester: Secondary | ICD-10-CM

## 2017-05-19 DIAGNOSIS — O99842 Bariatric surgery status complicating pregnancy, second trimester: Secondary | ICD-10-CM

## 2017-05-19 DIAGNOSIS — Z9889 Other specified postprocedural states: Secondary | ICD-10-CM | POA: Diagnosis not present

## 2017-05-19 DIAGNOSIS — O09522 Supervision of elderly multigravida, second trimester: Secondary | ICD-10-CM | POA: Insufficient documentation

## 2017-05-19 DIAGNOSIS — Z90721 Acquired absence of ovaries, unilateral: Secondary | ICD-10-CM | POA: Insufficient documentation

## 2017-05-19 DIAGNOSIS — O99840 Bariatric surgery status complicating pregnancy, unspecified trimester: Secondary | ICD-10-CM

## 2017-05-19 DIAGNOSIS — Z8249 Family history of ischemic heart disease and other diseases of the circulatory system: Secondary | ICD-10-CM | POA: Diagnosis not present

## 2017-05-19 DIAGNOSIS — O10012 Pre-existing essential hypertension complicating pregnancy, second trimester: Secondary | ICD-10-CM | POA: Diagnosis not present

## 2017-05-19 DIAGNOSIS — O09811 Supervision of pregnancy resulting from assisted reproductive technology, first trimester: Secondary | ICD-10-CM

## 2017-05-19 MED ORDER — AZITHROMYCIN 250 MG PO TABS
1000.0000 mg | ORAL_TABLET | Freq: Once | ORAL | Status: AC
  Administered 2017-05-19: 1000 mg via ORAL
  Filled 2017-05-19: qty 4

## 2017-05-19 MED ORDER — MAGNESIUM SULFATE BOLUS VIA INFUSION
4.0000 g | Freq: Once | INTRAVENOUS | Status: AC
  Administered 2017-05-19: 4 g via INTRAVENOUS
  Filled 2017-05-19: qty 500

## 2017-05-19 MED ORDER — MAGNESIUM SULFATE 40 G IN LACTATED RINGERS - SIMPLE
2.0000 g/h | INTRAVENOUS | Status: DC
  Administered 2017-05-19: 2 g/h via INTRAVENOUS
  Filled 2017-05-19: qty 40

## 2017-05-19 MED ORDER — SODIUM CHLORIDE 0.9 % IV SOLN
2.0000 g | Freq: Once | INTRAVENOUS | Status: AC
  Administered 2017-05-19: 2 g via INTRAVENOUS
  Filled 2017-05-19: qty 2000

## 2017-05-19 NOTE — MAU Provider Note (Signed)
History     CSN: [id number]  Arrival date and time: 05/19/17 1523   First Provider Initiated Contact with Patient 05/19/17 1547      No chief complaint on file.  HPI 36yo G1 at [redacted]w[redacted]d. Pregnancy complicated by h/o sleeve bypass surgery, h/o insulin dependent diabetes, h/o CHTN. These were corrected. Patient sent to MAU from MFM due to cervical insufficiency. She was seen last week and had an US showing an incompetent cervix with a thickness of 7mm. She was given steroids on 9/25 & 9/26 through MFM. She was not admitted at [facility] due to her being below the age of resuscitation. She was offered admission at [facility], but this was declined. She was also started on prometrium. Today, her US shows dilation of 1-2 cm with bulging membranes at the os.   Last US shows Vtx/transverse position, EFW 550g / 537g.   OB History    Gravida Para Term Preterm AB Living   1   0 0 0 0   SAB TAB Ectopic Multiple Live Births   0              Past Medical History:  Diagnosis Date  . Depression   . Diabetes mellitus without complication (HCC)    off meds since wt loss  . History of unilateral salpingectomy, left   . Hypertension    off meds since wt loss  . Infertility, female    PCOS  . PCOS (polycystic ovarian syndrome)     Past Surgical History:  Procedure Laterality Date  . APPENDECTOMY    . BREAST SURGERY    . GASTRIC BYPASS  2016    Family History  Problem Relation Age of Onset  . Diabetes Father   . Kidney disease Father   . Hypertension Father   . Hearing loss Father     Social History  Substance Use Topics  . Smoking status: Former Smoker    Types: Cigarettes  . Smokeless tobacco: Never Used     Comment: quit 2016  . Alcohol use No    Allergies: No Known Allergies  Prescriptions Prior to Admission  Medication Sig Dispense Refill Last Dose  . acetaminophen (TYLENOL) 500 MG tablet Take 500 mg by mouth every 6 (six) hours as needed for mild pain, moderate pain or  headache.   Taking  . Prenatal Vit-Fe Fumarate-FA (PRENATAL MULTIVITAMIN) TABS tablet Take 1 tablet by mouth at bedtime.   Taking  . progesterone (PROMETRIUM) 200 MG capsule Place one capsule vaginally at bedtime 90 capsule 2 Taking    Review of Systems Physical Exam   Blood pressure 122/85, pulse (!) 113, temperature 98.1 F (36.7 C), temperature source Oral, resp. rate 16, last menstrual period 11/30/2016.  Physical Exam  Constitutional: She is oriented to person, place, and time. She appears well-developed and well-nourished.  HENT:  Head: Normocephalic and atraumatic.  Right Ear: External ear normal.  Left Ear: External ear normal.  Eyes: Pupils are equal, round, and reactive to light. Conjunctivae are normal.  Cardiovascular: Normal rate.   Respiratory: Effort normal. No respiratory distress.  GI: Soft. Bowel sounds are normal. She exhibits no distension. There is no tenderness. There is no rebound.  Measurements consistent with twins. No contractions palpated.   Neurological: She is alert and oriented to person, place, and time.  Skin: Skin is warm and dry.  Psychiatric: She has a normal mood and affect. Her behavior is normal. Judgment and thought content normal.   FHT: 120s/120s. Moderate  variability. No accels. No decels. Appropriate for GA. No contractions seen on tocometry.  MAU Course  Procedures  MDM Start magnesium 4g bolus with 2g continuous infusion. I discussed this patient with Neonatology. Due to high census and the possible complex needs of the babies if delivered prematurely in the next few days, will need to transfer to [place].  Assessment and Plan  1. Incompetent Cervix 2. Di/di twins 3. [redacted]w[redacted]d gestation 4. AMA 5. H/o Gastric Sleeve 6. H/o CHTN 7. H/o IDDM - corrected after gastric sleeve, HgA1c 5.5.  Magnesium started. Continue Prometrium BMZ given 9/25 & 9/26 Continuous monitoring. Will need to transfer to [facility]. Spoke with Dr Cliffton Asters, MFM  fellow, who accepted the patient. Will give Ampicillin and azithromycin per her request.   Levie Heritage 05/19/2017, 4:11 PM

## 2017-05-19 NOTE — ED Notes (Signed)
Report given to L. Vickki Muff, Charity fundraiser.  Pt to MAU.

## 2017-05-19 NOTE — MAU Note (Signed)
Urine in lab 

## 2017-05-19 NOTE — MAU Note (Signed)
Pt sent over from MFM with shortened cervix.

## 2017-05-28 ENCOUNTER — Encounter: Payer: Self-pay | Admitting: *Deleted

## 2017-05-29 ENCOUNTER — Encounter: Payer: Medicaid Other | Admitting: Family Medicine

## 2017-05-29 ENCOUNTER — Encounter: Payer: Self-pay | Admitting: *Deleted

## 2017-06-01 ENCOUNTER — Encounter: Payer: Medicaid Other | Admitting: Family Medicine

## 2017-06-10 ENCOUNTER — Encounter: Payer: Medicaid Other | Admitting: Obstetrics and Gynecology

## 2017-06-24 ENCOUNTER — Encounter: Payer: Medicaid Other | Admitting: Obstetrics and Gynecology

## 2017-07-22 ENCOUNTER — Ambulatory Visit: Payer: Medicaid Other | Admitting: Advanced Practice Midwife

## 2017-12-06 ENCOUNTER — Encounter (HOSPITAL_COMMUNITY): Payer: Self-pay

## 2018-02-16 IMAGING — US US MFM OB DETAIL EACH ADDL GEST+14 WK
1 series · 12 of 28 positions shown · non-contrast
Comparison: none

[Series 1: us mfm ob detail each addl gest+14 wk · 162 acquisitions, 12 frames shown]
[im 6/162]
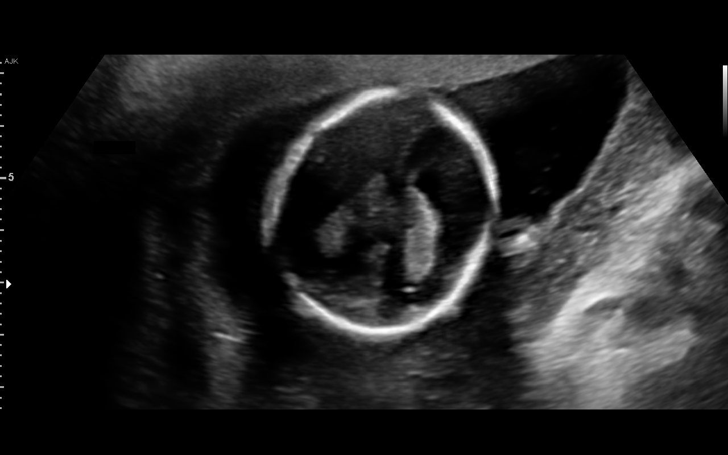
[im 18/162]
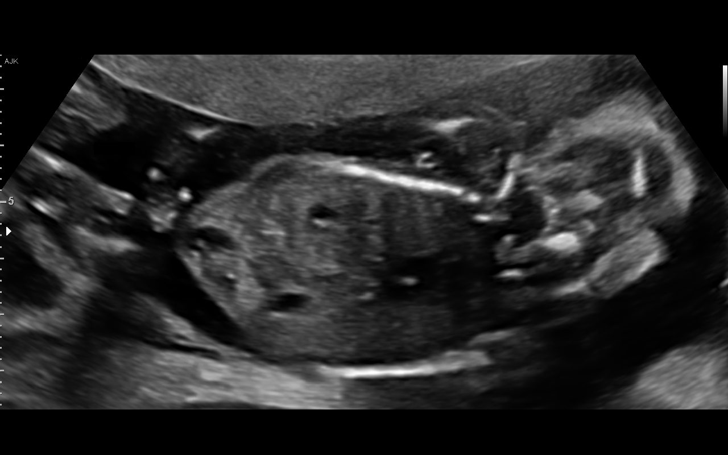
[im 30/162]
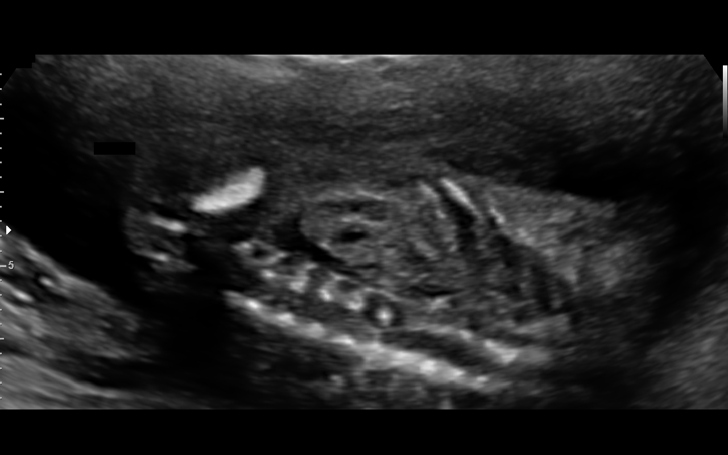
[im 48/162]
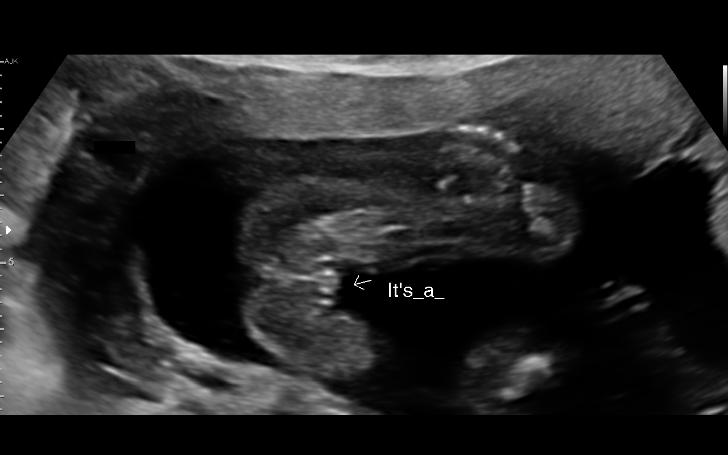
[im 60/162]
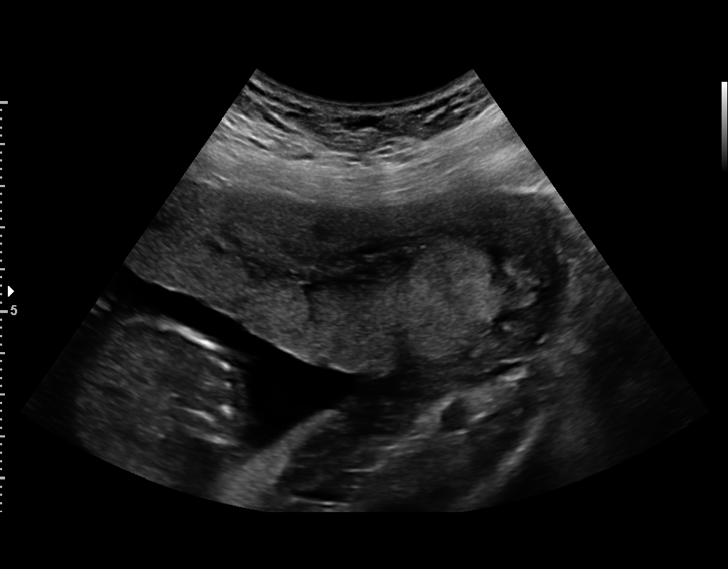
[im 72/162]
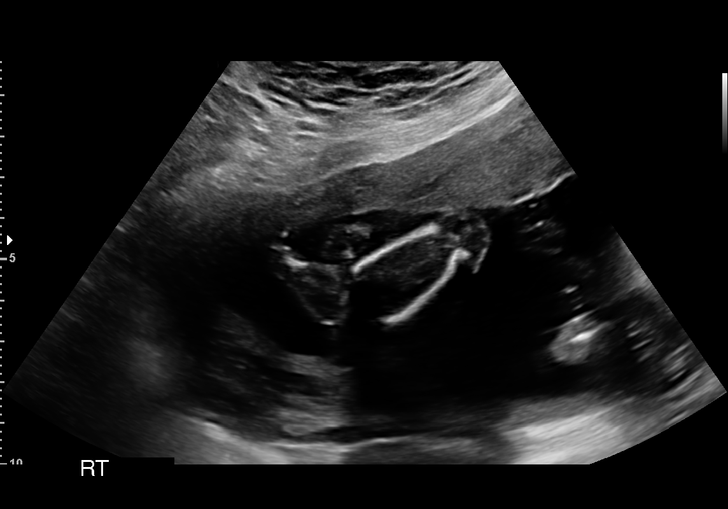
[im 90/162]
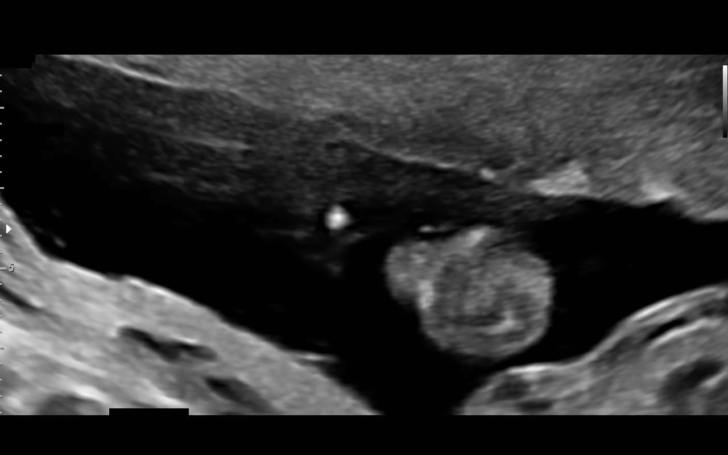
[im 102/162]
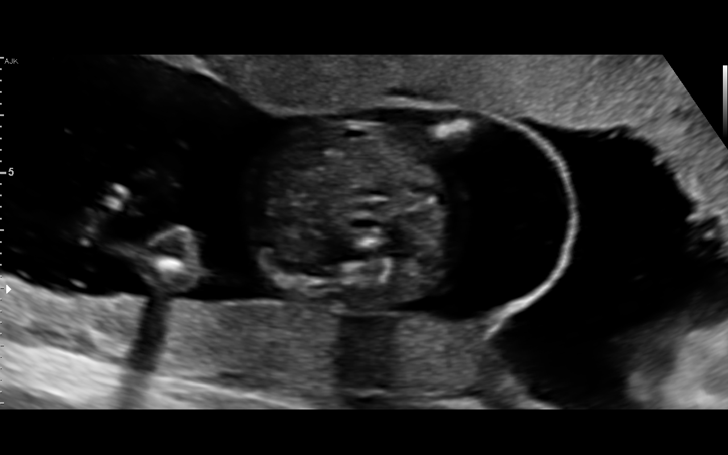
[im 114/162]
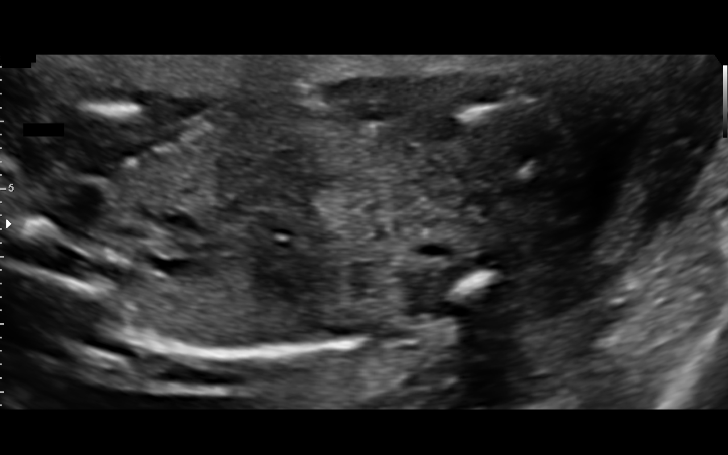
[im 132/162]
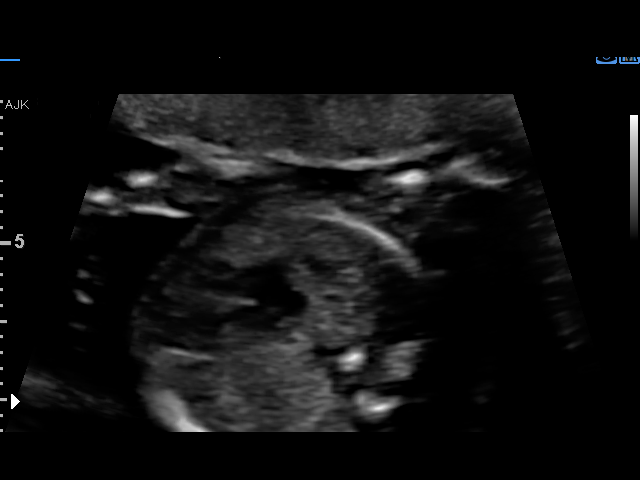
[im 144/162]
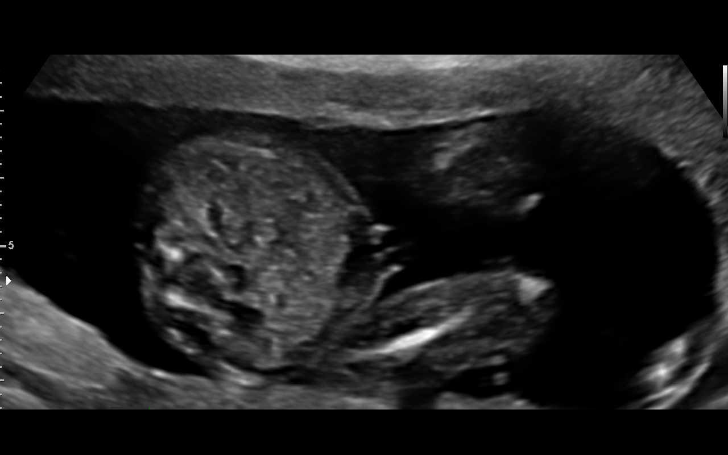
[im 156/162]
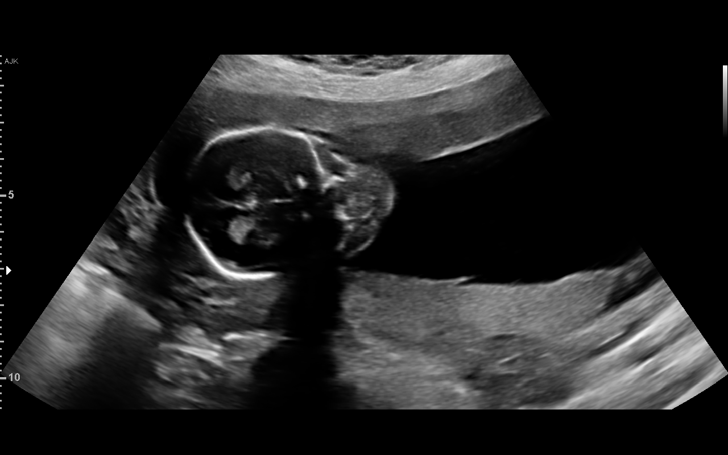

[12 of 28 positions shown; findings below may reference images not displayed]

OB/Gyn Clinic

WK

1  JANAY GOPAL          [id number]      [id number]     [id number]
2  JANAY GOPAL          [id number]      [id number]     [id number]
Indications

18 weeks gestation of pregnancy
Pregnancy resulting from assisted
reproductive technology
Hypertension - Chronic/Pre-existing
Encounter for fetal anatomic survey
Advanced maternal age primigravida 35+,
second trimester (declined testing)
Pregnancy complicated by previous gastric
bypass, antepartum, second trimester
Pre-existing diabetes, type 2, in pregnancy,
second trimester (diet)
Twin pregnancy, di/di, second trimester
OB History

Gravidity:    1
Fetal Evaluation (Fetus A)

Num Of Fetuses:     2
Fetal Heart         150
Rate(bpm):
Cardiac Activity:   Observed
Fetal Lie:          Lower Fetus
Presentation:       Cephalic
Placenta:           Anterior, above cervical os
P. Cord Insertion:  Marginal insertion

Amniotic Fluid
AFI FV:      Subjectively within normal limits
Biometry (Fetus A)

BPD:      40.5  mm     G. Age:  18w 2d         57  %    CI:        75.32   %   70 - 86
FL/HC:      18.7   %   15.8 - 18
HC:       148   mm     G. Age:  17w 6d         32  %    HC/AC:      1.06       1.07 -
AC:      139.3  mm     G. Age:  19w 2d         82  %    FL/BPD:     68.4   %
FL:       27.7  mm     G. Age:  18w 3d         57  %    FL/AC:      19.9   %   20 - 24
CER:      17.7  mm     G. Age:  17w 5d         38  %
NFT:         4  mm
CM:        4.6  mm

Est. FW:     258  gm      0 lb 9 oz     59  %     FW Discordancy      0 \ 9 %
Gestational Age (Fetus A)

LMP:           19w 1d       Date:   11/30/16                 EDD:   09/06/17
U/S Today:     18w 3d                                        EDD:   09/11/17
Best:          18w 1d    Det. By:   D.O. Conception          EDD:   09/13/17
(12/21/16)
Anatomy (Fetus A)

Cranium:               Appears normal         Aortic Arch:            Appears normal
Cavum:                 Appears normal         Ductal Arch:            Appears normal
Ventricles:            Appears normal         Diaphragm:              Appears normal
Choroid Plexus:        Appears normal         Stomach:                Appears normal, left
sided
Cerebellum:            Appears normal         Abdomen:                Appears normal
Posterior Fossa:       Appears normal         Abdominal Wall:         Appears nml (cord
insert, abd wall)
Nuchal Fold:           Appears normal         Cord Vessels:           Appears normal (3
vessel cord)
Face:                  Appears normal         Kidneys:                Appear normal
(orbits and profile)
Lips:                  Appears normal         Bladder:                Appears normal
Thoracic:              Appears normal         Spine:                  Not well visualized
Heart:                 Not well visualized    Upper Extremities:      Appears normal
RVOT:                  Appears normal         Lower Extremities:      Appears normal
LVOT:                  Appears normal

Other:  Fetus appears to be a female. Heels and 5th digit visualized. Nasal
bone visualized.

Fetal Evaluation (Fetus B)

Num Of Fetuses:     2
Fetal Heart         148
Rate(bpm):
Cardiac Activity:   Observed
Presentation:       Transverse, head to maternal right
Placenta:           Posterior, above cervical os
P. Cord Insertion:  Visualized, central
Amniotic Fluid
AFI FV:      Subjectively within normal limits

Largest Pocket(cm)
4.15
Biometry (Fetus B)

BPD:        40  mm     G. Age:  18w 1d         51  %    CI:        71.53   %   70 - 86
FL/HC:      16.8   %   15.8 - 18
HC:      150.6  mm     G. Age:  18w 1d         41  %    HC/AC:      1.11       1.07 -
AC:      135.6  mm     G. Age:  19w 0d         76  %    FL/BPD:     63.3   %
FL:       25.3  mm     G. Age:  17w 5d         27  %    FL/AC:      18.7   %   20 - 24
CER:      18.2  mm     G. Age:  18w 1d         48  %
NFT:       4.3  mm
CM:        4.6  mm

Est. FW:     235  gm      0 lb 8 oz     52  %     FW Discordancy         9  %
Gestational Age (Fetus B)

LMP:           19w 1d       Date:   11/30/16                 EDD:   09/06/17
U/S Today:     18w 2d                                        EDD:   09/12/17
Best:          18w 1d    Det. By:   D.O. Conception          EDD:   09/13/17
(12/21/16)
Anatomy (Fetus B)

Cranium:               Appears normal         Aortic Arch:            Appears normal
Cavum:                 Appears normal         Ductal Arch:            Appears normal
Ventricles:            Appears normal         Diaphragm:              Appears normal
Choroid Plexus:        Appears normal         Stomach:                Appears normal, left
sided
Cerebellum:            Appears normal         Abdomen:                Appears normal
Posterior Fossa:       Appears normal         Abdominal Wall:         Appears nml (cord
insert, abd wall)
Nuchal Fold:           Appears normal         Cord Vessels:           Appears normal (3
vessel cord)
Face:                  Appears normal         Kidneys:                Appear normal
(orbits and profile)
Lips:                  Appears normal         Bladder:                Appears normal
Thoracic:              Appears normal         Spine:                  Not well visualized
Heart:                 Appears normal         Upper Extremities:      Appears normal
(4CH, axis, and
situs)
RVOT:                  Appears normal         Lower Extremities:      Appears normal
LVOT:                  Appears normal

Other:  Fetus appears to be a female. Nasal bone visualized. Technically
difficult due to maternal habitus and fetal position.
Cervix Uterus Adnexa

Cervix
Length:            3.1  cm.
Normal appearance by transabdominal scan.

Uterus
No abnormality visualized.
Left Ovary
Not visualized.

Right Ovary
Not visualized.

Adnexa:       No abnormality visualized.
Impression

Diamniotic Dichorionic intrauterine pregnancy at 18+1 weeks
with AMA, hypertension and diabetes
Presentation is cephalic/transverse
Normal amniotic fluid in each sac
Estimated fetal weights are 258g for A and 235g for B, 59th
and 52nd percentiles respectively. Discordance is 9%
Review of anatomy shows no evidence of structural
anomalies or sonographic markers for aneuploidy
Anatomic evaluation is suboptimal due to fetal position
Recommendations

Repeat scan in 4 weeks to check growth and confirm normal
anatomy

## 2018-03-17 IMAGING — US US MFM OB TRANSVAGINAL
1 series · 12 of 28 positions shown · non-contrast
Comparison: none

[Series 1: us mfm ob transvaginal · 84 acquisitions, 12 frames shown]
[im 4/84]
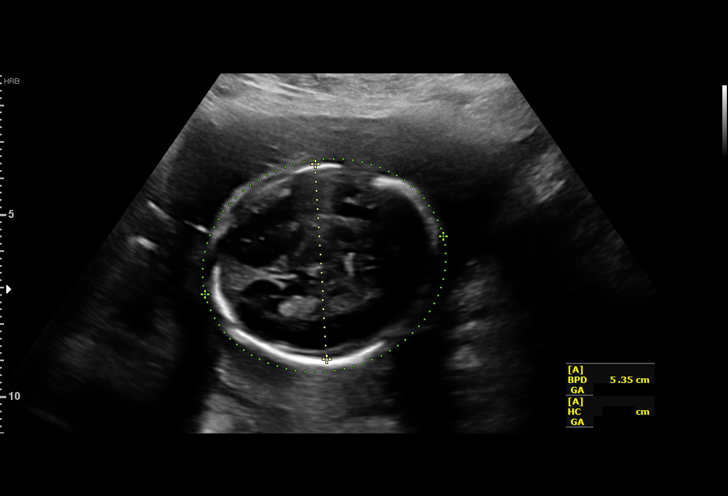
[im 10/84]
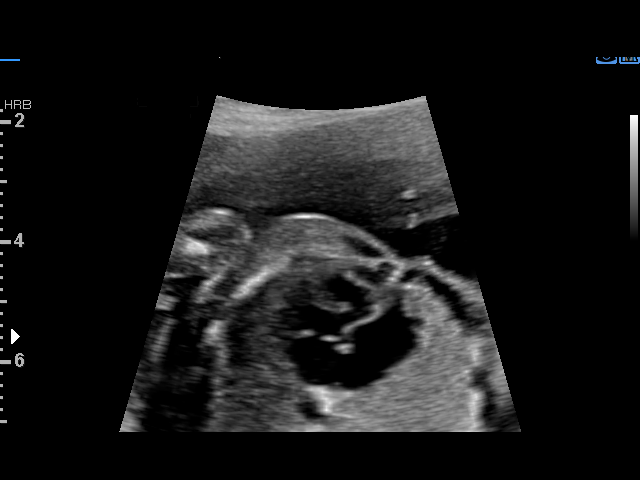
[im 16/84]
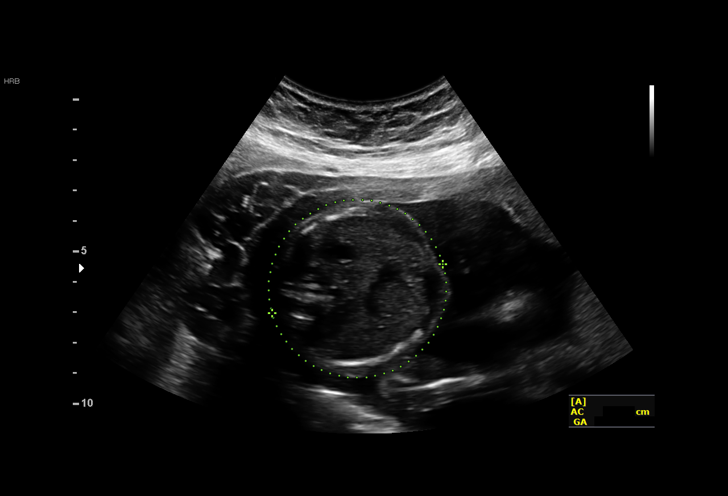
[im 25/84]
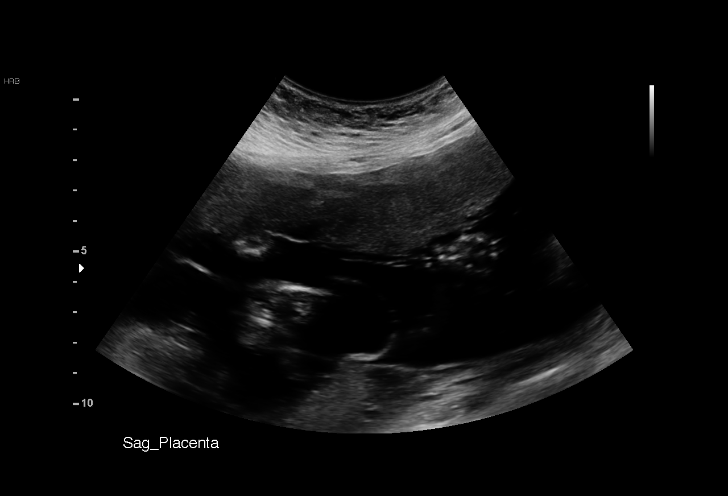
[im 31/84]
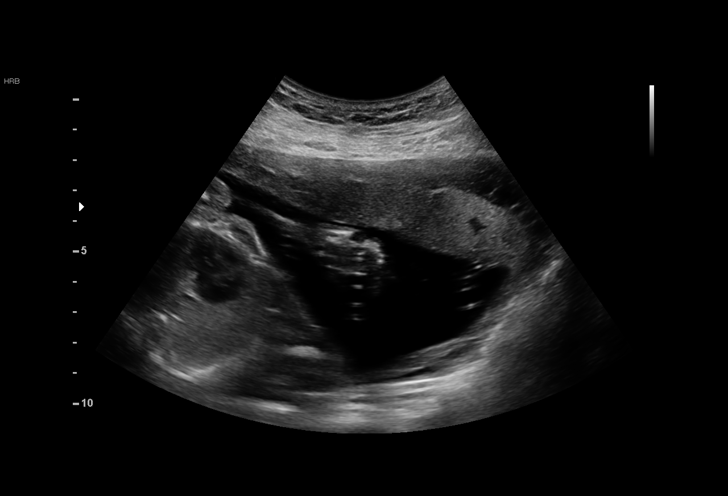
[im 37/84]
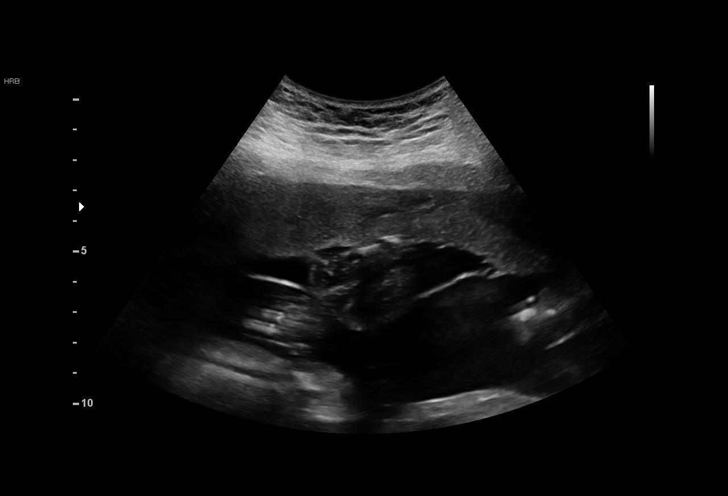
[im 47/84]
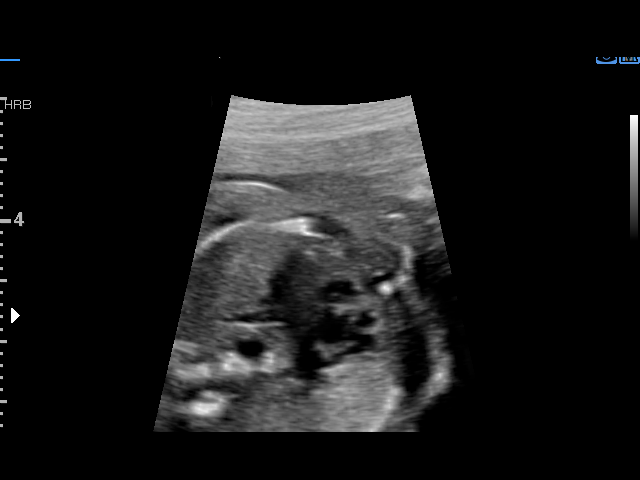
[im 53/84]
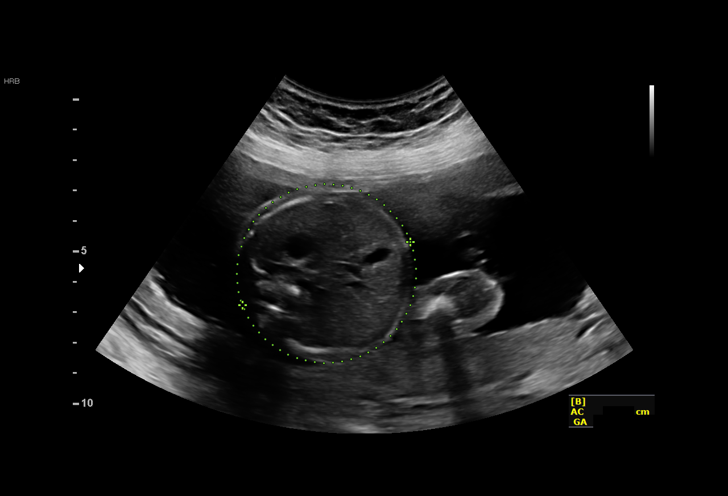
[im 59/84]
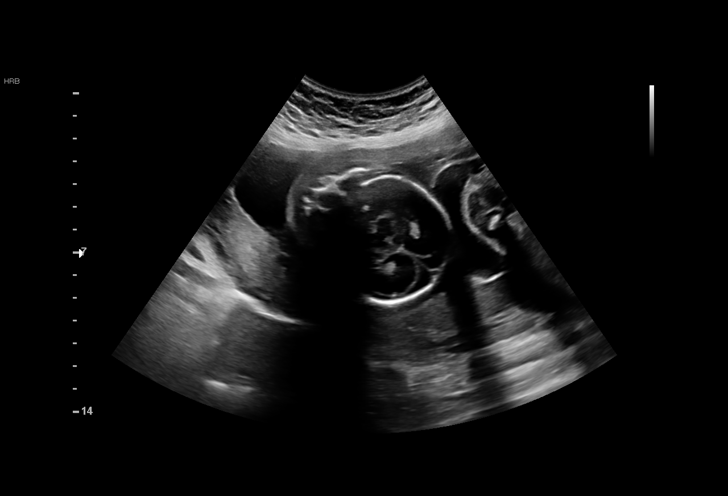
[im 68/84]
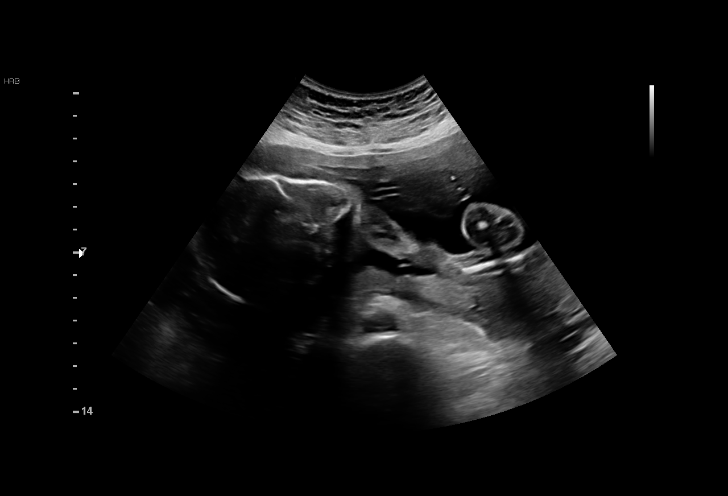
[im 74/84]
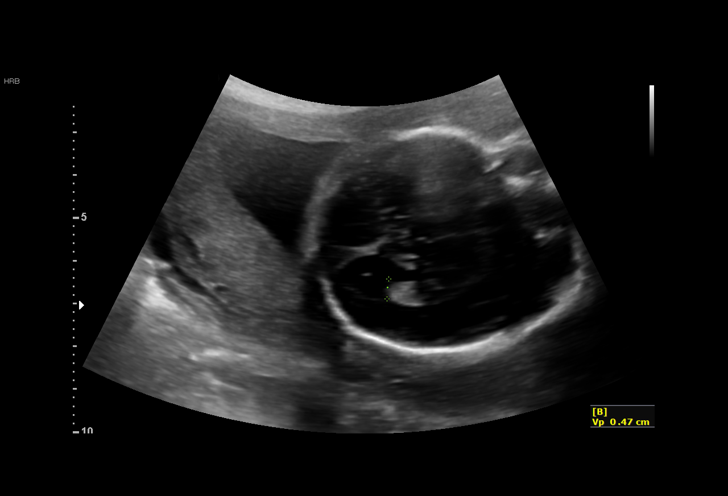
[im 80/84]
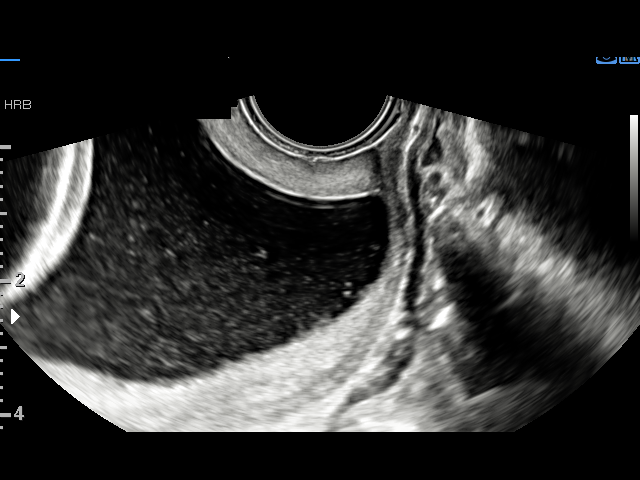

[12 of 28 positions shown; findings below may reference images not displayed]

OB/Gyn Clinic

1  ENSHIRAH SAMANI              [id number]      [id number]     [id number]
2  ENSHIRAH SAMANI              [id number]      [id number]     [id number]
3  ENSHIRAH SAMANI              [id number]      [id number]     [id number]
Indications

22 weeks gestation of pregnancy
Pregnancy resulting from assisted
reproductive technology
Hypertension - Chronic/Pre-existing
Advanced maternal age primigravida 35+,
second trimester (declined testing)
Pregnancy complicated by previous gastric
bypass, antepartum, second trimester
Pre-existing diabetes, type 2, in pregnancy,
second trimester (diet)
Twin pregnancy, di/di, second trimester
Cervical incompetence, second trimester
OB History

Gravidity:    1
Fetal Evaluation (Fetus A)

Num Of Fetuses:     2
Fetal Heart         143
Rate(bpm):
Cardiac Activity:   Observed
Presentation:       Cephalic
Placenta:           Anterior, above cervical os
P. Cord Insertion:  Previously Visualized

Amniotic Fluid
AFI FV:      Subjectively within normal limits

Largest Pocket(cm)
6.74
Biometry (Fetus A)

BPD:      53.1  mm     G. Age:  22w 1d         41  %    CI:        74.81   %   70 - 86
FL/HC:      20.6   %   18.4 -
HC:      194.8  mm     G. Age:  21w 5d         17  %    HC/AC:      1.05       1.06 -
AC:       185   mm     G. Age:  23w 2d         74  %    FL/BPD:     75.5   %   71 - 87
FL:       40.1  mm     G. Age:  23w 0d         62  %    FL/AC:      21.7   %   20 - 24
HUM:      35.5  mm     G. Age:  22w 2d         46  %

Est. FW:     550  gm      1 lb 3 oz     59  %     FW Discordancy      0 \ 2 %
Gestational Age (Fetus A)

LMP:           23w 2d       Date:   11/30/16                 EDD:   09/06/17
U/S Today:     22w 4d                                        EDD:   09/11/17
Best:          22w 2d    Det. By:   D.O. Conception          EDD:   09/13/17
(12/21/16)
Anatomy (Fetus A)

Cranium:               Appears normal         Aortic Arch:            Previously seen
Cavum:                 Appears normal         Ductal Arch:            Previously seen
Ventricles:            Appears normal         Diaphragm:              Appears normal
Choroid Plexus:        Previously seen        Stomach:                Appears normal, left
sided
Cerebellum:            Previously seen        Abdomen:                Appears normal
Posterior Fossa:       Previously seen        Abdominal Wall:         Previously seen
Nuchal Fold:           Previously seen        Cord Vessels:           Previously seen
Face:                  Orbits and profile     Kidneys:                Appear normal
previously seen
Lips:                  Previously seen        Bladder:                Appears normal
Thoracic:              Appears normal         Spine:                  Not well visualized
Heart:                 Appears normal         Upper Extremities:      Previously seen
(4CH, axis, and situs
RVOT:                  Appears normal         Lower Extremities:      Previously seen
LVOT:                  Appears normal

Other:  Fetus appears to be a female. Heels and 5th digit visualized. Nasal
bone visualized.

Fetal Evaluation (Fetus B)

Num Of Fetuses:     2
Fetal Heart         153
Rate(bpm):
Cardiac Activity:   Observed
Presentation:       Transverse, head to maternal right
Placenta:           Posterior, above cervical os
P. Cord Insertion:  Previously Visualized
Amniotic Fluid
AFI FV:      Subjectively within normal limits

Largest Pocket(cm)
4.95
Biometry (Fetus B)

BPD:      54.4  mm     G. Age:  22w 4d         58  %    CI:        72.08   %   70 - 86
FL/HC:      19.0   %   18.4 -
HC:      203.9  mm     G. Age:  22w 3d         47  %    HC/AC:      1.11       1.06 -
AC:       184   mm     G. Age:  23w 1d         72  %    FL/BPD:     71.1   %   71 - 87
FL:       38.7  mm     G. Age:  22w 3d         44  %    FL/AC:      21.0   %   20 - 24
HUM:        35  mm     G. Age:  22w 0d         41  %

Est. FW:     537  gm      1 lb 3 oz     57  %     FW Discordancy         2  %
Gestational Age (Fetus B)

LMP:           23w 2d       Date:   11/30/16                 EDD:   09/06/17
U/S Today:     22w 5d                                        EDD:   09/10/17
Best:          22w 2d    Det. By:   D.O. Conception          EDD:   09/13/17
(12/21/16)
Anatomy (Fetus B)

Cranium:               Appears normal         Aortic Arch:            Appears normal
Cavum:                 Appears normal         Ductal Arch:            Appears normal
Ventricles:            Appears normal         Diaphragm:              Appears normal
Choroid Plexus:        Previously seen        Stomach:                Appears normal, left
sided
Cerebellum:            Previously seen        Abdomen:                Previously seen
Posterior Fossa:       Previously seen        Abdominal Wall:         Previously seen
Nuchal Fold:           Previously seen        Cord Vessels:           Previously seen
Face:                  Orbits and profile     Kidneys:                Previously seen
previously seen
Lips:                  Previously seen        Bladder:                Appears normal
Thoracic:              Appears normal         Spine:                  Not well visualized
Heart:                 Appears normal         Upper Extremities:      Previously seen
(4CH, axis, and
situs)
RVOT:                  Appears normal         Lower Extremities:      Previously seen
LVOT:                  Appears normal

Other:  Fetus appears to be a female. Nasal bone visualized. Technically
difficult due to maternal habitus and fetal position.
Cervix Uterus Adnexa

Cervix
Length:           0.71  cm.
Measured transvaginally.
Comments

There is a substainally increased risk of preterm delivery
given her short cervix in a twin gestation.  We discussed the
possibility of resuscitation at 23 weeks here at [REDACTED] and at 22 weeks at [facility] as Ms.
Ci is currently between these points of gestation.
She would like fetal resuscitation at this time and would be
willing to be transferred to [facility] in the event delivery was
imminent.  However, hospitalization is not necessary at this
time as she is asymptotmatic, but as she does desire
neonatal resuscitation at her present gestational age I have
given her a dose of Holu[place]heun in case delivery becomes
more likely later this week.  She will return to the [facility]
tomorrow for her second dose and we will repeat a cervical
length measurements next week at which time she will be
past 23 weeks.
Impression

Dichorionic diamniotic twin intrauterine pregnancy at 22
weeks 2 days.

Twin A
Appropriate fetal growth (59%).
Normal amniotic fluid volume.
The fetal anatomic survey is not complete.
No gross fetal anomalies identified.

Twin B
Appropriate fetal growth (57%).
Normal amniotic fluid volume.
The fetal anatomic survey is not complete.
No gross fetal anomalies identified.

Insignificant fetal growth discordance of 2%.
Transvaginal cervical length of 0.7 cm with "U" shaped
funeling.
Recommendations

See comments.

## 2018-03-24 IMAGING — US US MFM OB TRANSVAGINAL
1 series · 15 of 28 positions shown · non-contrast
Comparison: none

[Series 1: us mfm ob transvaginal · 29 acquisitions, 15 frames shown]
[im 1/29]
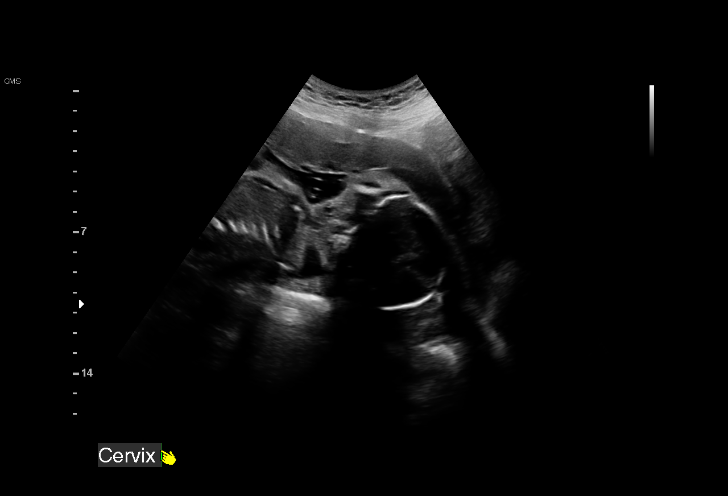
[im 3/29]
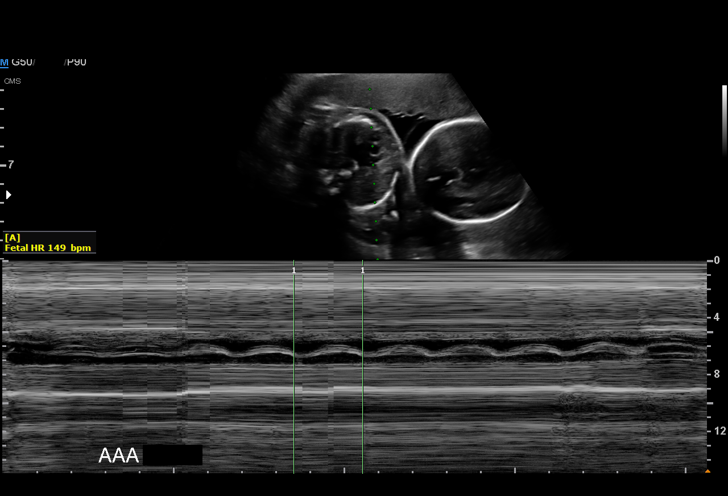
[im 5/29]
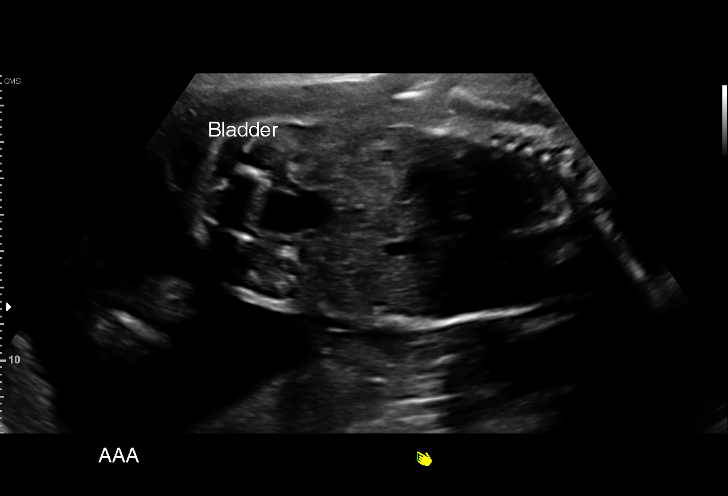
[im 7/29]
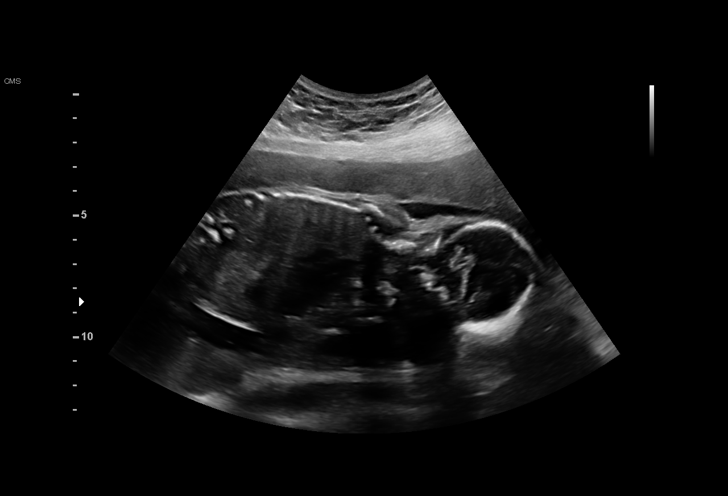
[im 9/29]
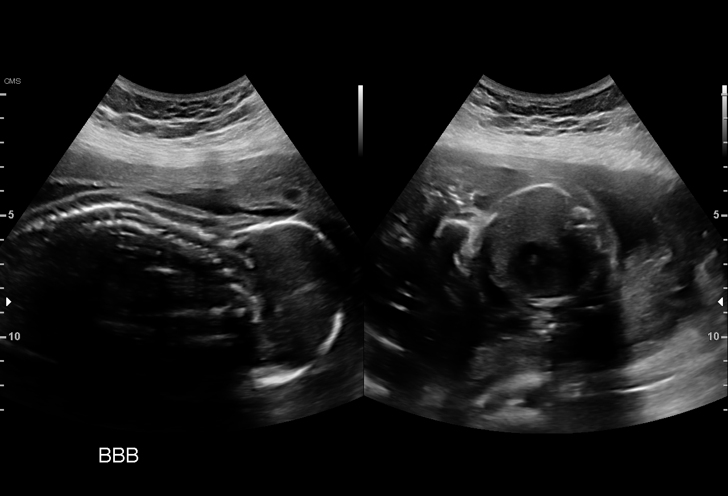
[im 11/29]
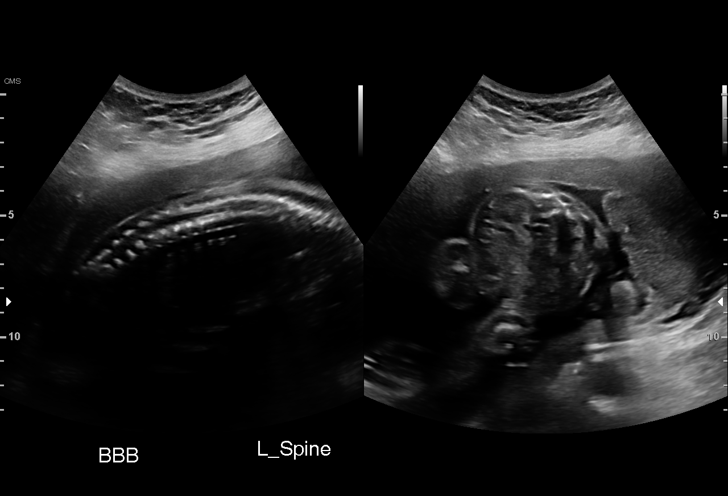
[im 13/29]
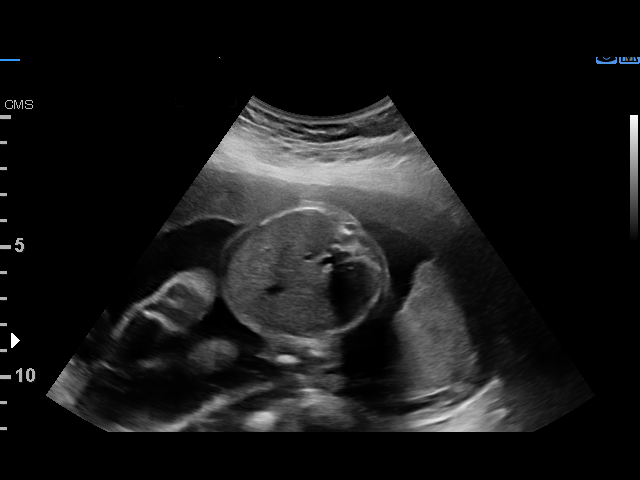
[im 15/29]
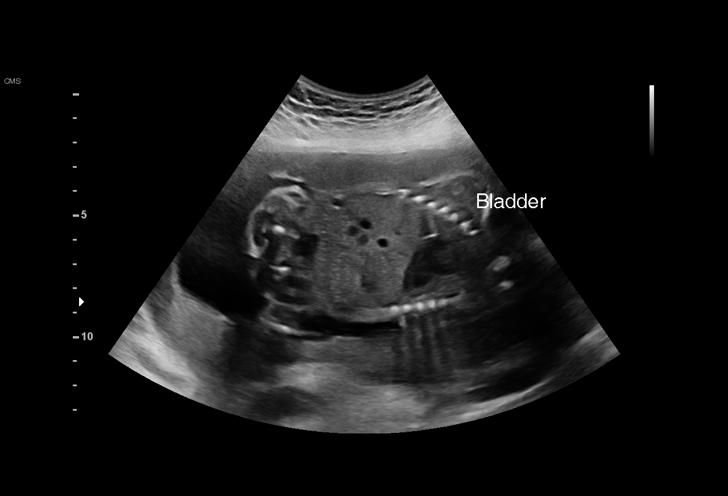
[im 16/29]
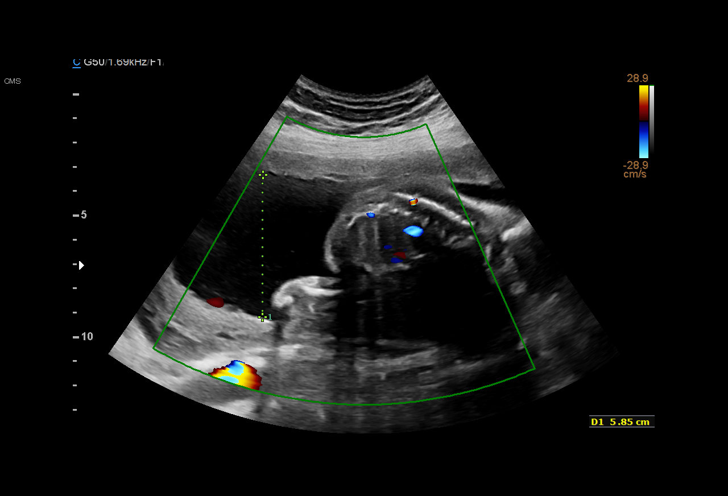
[im 18/29]
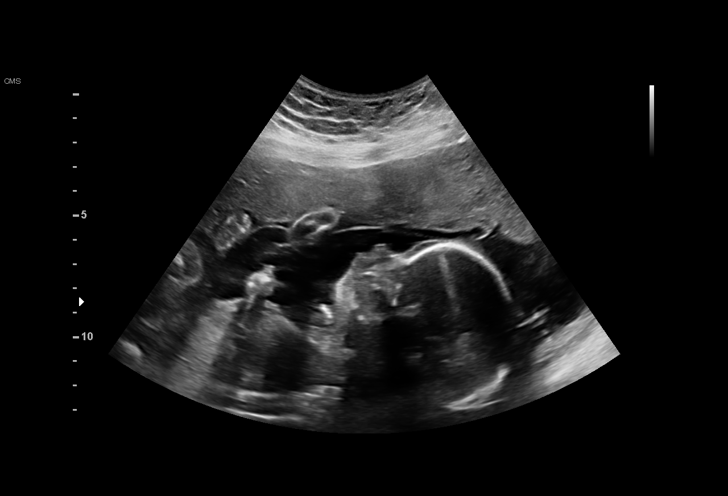
[im 20/29]
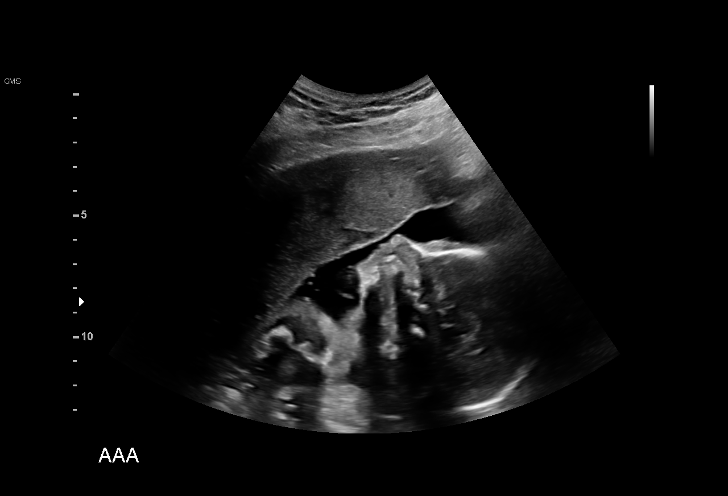
[im 22/29]
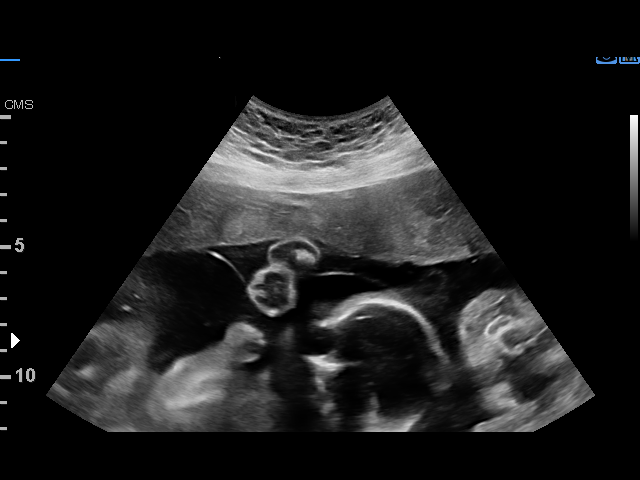
[im 24/29]
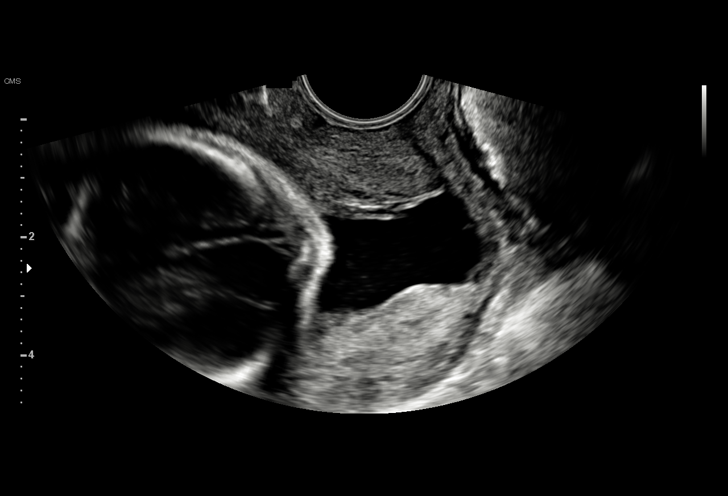
[im 26/29]
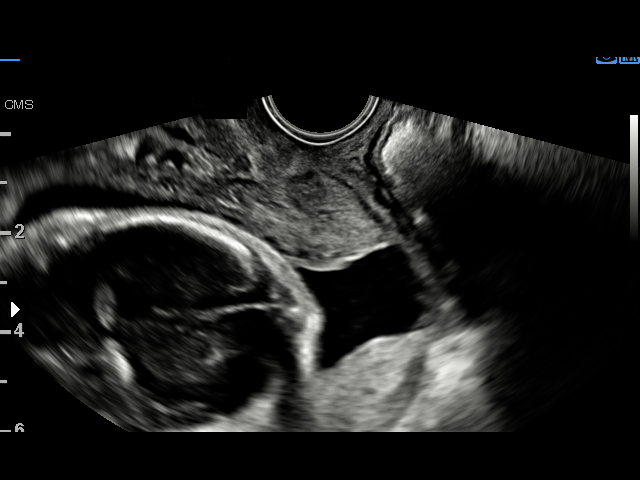
[im 29/29]
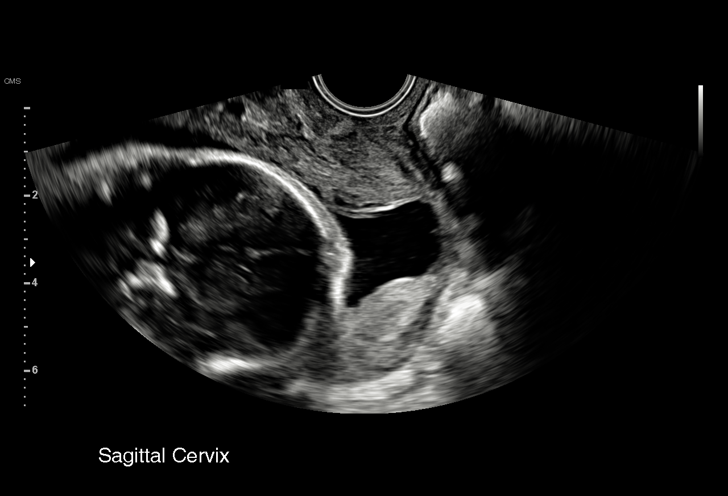

[15 of 28 positions shown; findings below may reference images not displayed]

OB/Gyn Clinic

1  ATILIO BANKS           [id number]      [id number]     [id number]
Indications

23 weeks gestation of pregnancy
Pregnancy resulting from assisted
reproductive technology
Hypertension - Chronic/Pre-existing
Advanced maternal age primigravida 35+,
second trimester (declined testing)
Pregnancy complicated by previous gastric
bypass, antepartum, second trimester
Pre-existing diabetes, type 2, in pregnancy,
second trimester (diet)
Twin pregnancy, di/di, second trimester
Cervical incompetence, second trimester
Encounter for cervical length
OB History

Gravidity:    1
Fetal Evaluation (Fetus A)

Num Of Fetuses:     2
Fetal Heart         149
Rate(bpm):
Cardiac Activity:   Observed
Fetal Lie:          Lower Fetus
Presentation:       Cephalic
Placenta:           Anterior, above cervical os
P. Cord Insertion:  Marginal insertion
Membrane Desc:      Dividing Membrane seen - Dichorionic.
Amniotic Fluid
AFI FV:      Subjectively within normal limits

Largest Pocket(cm)
3.45
Gestational Age (Fetus A)

LMP:           24w 2d        Date:  11/30/16                 EDD:   09/06/17
Best:          23w 2d     Det. By:  D.O. Conception          EDD:   09/13/17
(12/21/16)

Fetal Evaluation (Fetus B)

Num Of Fetuses:     2
Fetal Heart         154
Rate(bpm):
Cardiac Activity:   Observed
Fetal Lie:          Upper Fetus
Presentation:       Cephalic
Placenta:           Posterior, above cervical os
P. Cord Insertion:  Previously Visualized
Membrane Desc:      Dividing Membrane seen - Dichorionic.

Amniotic Fluid
AFI FV:      Subjectively within normal limits

Largest Pocket(cm)
5.85
Gestational Age (Fetus B)

LMP:           24w 2d        Date:  11/30/16                 EDD:   09/06/17
Best:          23w 2d     Det. By:  D.O. Conception          EDD:   09/13/17
(12/21/16)
Cervix Uterus Adnexa

Cervix
Dilated
Impression

Singleton intrauterine pregnancy at 23 weeks 2 days
gestation with fetal cardiac activity x 2
Cephalic cephalic presentation
Cervix dilated with membranes to the cervical os
Recommendations

Discussed with Dr. Pao about hospitalization [REDACTED]
(based on NICU census) or DHIRAJ

## 2019-11-24 ENCOUNTER — Ambulatory Visit: Payer: Self-pay | Attending: Internal Medicine

## 2019-11-24 DIAGNOSIS — Z23 Encounter for immunization: Secondary | ICD-10-CM

## 2019-11-24 NOTE — Progress Notes (Signed)
   Covid-19 Vaccination Clinic  Name:  Lyrica Mcclarty    MRN: [id number] DOB: [birth year]  11/24/2019  Ms. Pracht was observed post Covid-19 immunization for 15 minutes without incident. She was provided with Vaccine Information Sheet and instruction to access the V-Safe system.   Ms. Arizmendi was instructed to call 911 with any severe reactions post vaccine: Marland Kitchen Difficulty breathing  . Swelling of face and throat  . A fast heartbeat  . A bad rash all over body  . Dizziness and weakness   Immunizations Administered    Name Date Dose VIS Date Route   Pfizer COVID-19 Vaccine 11/24/2019  4:31 PM 0.3 mL 07/29/2019 Intramuscular   Manufacturer: [employer], [employer]   Lot: [id number]   NDC: [address]-0

## 2019-12-19 ENCOUNTER — Ambulatory Visit: Payer: Medicaid Other | Attending: Internal Medicine

## 2019-12-19 DIAGNOSIS — Z23 Encounter for immunization: Secondary | ICD-10-CM

## 2019-12-19 NOTE — Progress Notes (Signed)
   Covid-19 Vaccination Clinic  Name:  Gina Henderson    MRN: [id number] DOB: 05-17-1981  12/19/2019  [name] was observed post Covid-19 immunization for 15 minutes without incident. She was provided with Vaccine Information Sheet and instruction to access the V-Safe system.   [name] was instructed to call 911 with any severe reactions post vaccine: Marland Kitchen Difficulty breathing  . Swelling of face and throat  . A fast heartbeat  . A bad rash all over body  . Dizziness and weakness   Immunizations Administered    Name Date Dose VIS Date Route   Pfizer COVID-19 Vaccine 12/19/2019  3:00 PM 0.3 mL 10/12/2018 Intramuscular   Manufacturer: [employer], [employer]   Lot: [id number]   NDC: [address]-8

## 2020-12-19 ENCOUNTER — Other Ambulatory Visit: Payer: Self-pay

## 2020-12-19 ENCOUNTER — Observation Stay (HOSPITAL_COMMUNITY)
Admission: EM | Admit: 2020-12-19 | Discharge: 2020-12-20 | Disposition: A | Discharge status: Home / Self Care | Payer: BLUE CROSS/BLUE SHIELD | Attending: Internal Medicine | Admitting: Internal Medicine

## 2020-12-19 DIAGNOSIS — I10 Essential (primary) hypertension: Secondary | ICD-10-CM | POA: Insufficient documentation

## 2020-12-19 DIAGNOSIS — D509 Iron deficiency anemia, unspecified: Secondary | ICD-10-CM | POA: Diagnosis not present

## 2020-12-19 DIAGNOSIS — Z79899 Other long term (current) drug therapy: Secondary | ICD-10-CM | POA: Diagnosis not present

## 2020-12-19 DIAGNOSIS — E119 Type 2 diabetes mellitus without complications: Secondary | ICD-10-CM | POA: Insufficient documentation

## 2020-12-19 DIAGNOSIS — Z87891 Personal history of nicotine dependence: Secondary | ICD-10-CM | POA: Insufficient documentation

## 2020-12-19 DIAGNOSIS — D649 Anemia, unspecified: Secondary | ICD-10-CM | POA: Diagnosis not present

## 2020-12-19 DIAGNOSIS — Z20822 Contact with and (suspected) exposure to covid-19: Secondary | ICD-10-CM | POA: Diagnosis not present

## 2020-12-19 DIAGNOSIS — R5383 Other fatigue: Secondary | ICD-10-CM | POA: Diagnosis present

## 2020-12-19 LAB — CBC WITH DIFFERENTIAL/PLATELET
Abs Immature Granulocytes: 0.01 10*3/uL (ref 0.00–0.07)
Basophils Absolute: 0 10*3/uL (ref 0.0–0.1)
Basophils Relative: 1 %
Eosinophils Absolute: 0.1 10*3/uL (ref 0.0–0.5)
Eosinophils Relative: 1 %
HCT: 18.9 % — ABNORMAL LOW (ref 36.0–46.0)
Hemoglobin: 4.7 g/dL — CL (ref 12.0–15.0)
Immature Granulocytes: 0 %
Lymphocytes Relative: 34 %
Lymphs Abs: 1.2 10*3/uL (ref 0.7–4.0)
MCH: 15.4 pg — ABNORMAL LOW (ref 26.0–34.0)
MCHC: 24.9 g/dL — ABNORMAL LOW (ref 30.0–36.0)
MCV: 62 fL — ABNORMAL LOW (ref 80.0–100.0)
Monocytes Absolute: 0.2 10*3/uL (ref 0.1–1.0)
Monocytes Relative: 6 %
Neutro Abs: 2 10*3/uL (ref 1.7–7.7)
Neutrophils Relative %: 58 %
Platelets: 104 10*3/uL — ABNORMAL LOW (ref 150–400)
RBC: 3.05 MIL/uL — ABNORMAL LOW (ref 3.87–5.11)
RDW: 20.8 % — ABNORMAL HIGH (ref 11.5–15.5)
Smear Review: NORMAL
WBC: 3.5 10*3/uL — ABNORMAL LOW (ref 4.0–10.5)
nRBC: 0 % (ref 0.0–0.2)

## 2020-12-19 LAB — RESP PANEL BY RT-PCR (FLU A&B, COVID) ARPGX2
Influenza A by PCR: NEGATIVE
Influenza B by PCR: NEGATIVE
SARS Coronavirus 2 by RT PCR: NEGATIVE

## 2020-12-19 LAB — COMPREHENSIVE METABOLIC PANEL
ALT: 13 U/L (ref 0–44)
AST: 20 U/L (ref 15–41)
Albumin: 4.1 g/dL (ref 3.5–5.0)
Alkaline Phosphatase: 55 U/L (ref 38–126)
Anion gap: 8 (ref 5–15)
BUN: 9 mg/dL (ref 6–20)
CO2: 20 mmol/L — ABNORMAL LOW (ref 22–32)
Calcium: 8.6 mg/dL — ABNORMAL LOW (ref 8.9–10.3)
Chloride: 109 mmol/L (ref 98–111)
Creatinine, Ser: 0.63 mg/dL (ref 0.44–1.00)
GFR, Estimated: >60 mL/min (ref >60)
Glucose, Bld: 141 mg/dL — ABNORMAL HIGH (ref 70–99)
Potassium: 3.8 mmol/L (ref 3.5–5.1)
Sodium: 137 mmol/L (ref 135–145)
Total Bilirubin: 0.6 mg/dL (ref 0.3–1.2)
Total Protein: 6.7 g/dL (ref 6.5–8.1)

## 2020-12-19 LAB — I-STAT BETA HCG BLOOD, ED (MC, WL, AP ONLY): I-stat hCG, quantitative: <5 m[IU]/mL (ref <5)

## 2020-12-19 LAB — IRON AND TIBC
Iron: 367 ug/dL — ABNORMAL HIGH (ref 28–170)
Saturation Ratios: 70 % — ABNORMAL HIGH (ref 10.4–31.8)
TIBC: 521 ug/dL — ABNORMAL HIGH (ref 250–450)
UIBC: 154 ug/dL

## 2020-12-19 LAB — HEMOGLOBIN A1C
Hgb A1c MFr Bld: 5.8 % — ABNORMAL HIGH (ref 4.8–5.6)
Mean Plasma Glucose: 119.76 mg/dL

## 2020-12-19 LAB — FERRITIN: Ferritin: 2 ng/mL — ABNORMAL LOW (ref 11–307)

## 2020-12-19 LAB — ABO/RH: ABO/RH(D): O POS

## 2020-12-19 LAB — OCCULT BLOOD X 1 CARD TO LAB, STOOL: Fecal Occult Bld: NEGATIVE

## 2020-12-19 LAB — TSH: TSH: 1.003 u[IU]/mL (ref 0.350–4.500)

## 2020-12-19 LAB — HEMOGLOBIN AND HEMATOCRIT, BLOOD
HCT: 27.5 % — ABNORMAL LOW (ref 36.0–46.0)
Hemoglobin: 7.8 g/dL — ABNORMAL LOW (ref 12.0–15.0)

## 2020-12-19 LAB — HIV ANTIBODY (ROUTINE TESTING W REFLEX): HIV Screen 4th Generation wRfx: NONREACTIVE

## 2020-12-19 LAB — PREPARE RBC (CROSSMATCH)

## 2020-12-19 MED ORDER — ACETAMINOPHEN 650 MG RE SUPP: 650.0000 mg | Freq: Four times a day (QID) | RECTAL | Status: DC | PRN

## 2020-12-19 MED ORDER — MELATONIN 5 MG PO TABS
10.0000 mg | ORAL_TABLET | Freq: Every day | ORAL | Status: DC
  Administered 2020-12-19: 10 mg via ORAL
  Filled 2020-12-19: qty 2

## 2020-12-19 MED ORDER — SODIUM CHLORIDE 0.9 % IV SOLN
10.0000 mL/h | Freq: Once | INTRAVENOUS | Status: AC
  Administered 2020-12-19: 10 mL/h via INTRAVENOUS

## 2020-12-19 MED ORDER — SENNOSIDES-DOCUSATE SODIUM 8.6-50 MG PO TABS: 1.0000 | ORAL_TABLET | Freq: Every evening | ORAL | Status: DC | PRN

## 2020-12-19 MED ORDER — ACETAMINOPHEN 325 MG PO TABS
650.0000 mg | ORAL_TABLET | Freq: Four times a day (QID) | ORAL | Status: DC | PRN
  Administered 2020-12-19: 650 mg via ORAL
  Filled 2020-12-19: qty 2

## 2020-12-19 NOTE — ED Provider Notes (Signed)
Emergency Medicine Provider Triage Evaluation Note  Gina Henderson 40 y.o. female was evaluated in triage.  Pt complains of low hemoglobin.  Patient went to the doctor today and was noted to have low hemoglobin was sent to the emergency department for further evaluation.  She states she has been tired and fatigued lately.  States has been ongoing for about a year.  She denies any chest pain, difficulty breathing.  She has not any rectal bleeding, heavy menstrual cycles.  She states she has never been told her hemoglobin is low and has never had a transfusion.  Review of Systems  Positive: Fatigue, tiredness Negative: Chest pain, difficulty breathing, rectal bleeding  Physical Exam  BP 134/82   Pulse 70   Temp 98.2 F (36.8 C) (Oral)   Resp 18   Ht 5\' 4"  (1.626 m)   Wt 65.8 kg   SpO2 100%   BMI 24.89 kg/m  Gen:   Awake, no distress   HEENT:  Atraumatic. Pale conjunctiva bilaterally.  Resp:  Normal effort  Cardiac:  Normal rate  Abd:   Nondistended, nontender  MSK:   Moves extremities without difficulty  Neuro:  Speech clear   Medical Decision Making  Medically screening exam initiated at 3:55 AM.  Appropriate orders placed.  Gina Henderson was informed that the remainder of the evaluation will be completed by another provider, this initial triage assessment does not replace that evaluation, and the importance of remaining in the ED until their evaluation is complete.    Clinical Impression  Low Hgb   Portions of this note were generated with Dragon dictation software. Dictation errors may occur despite best attempts at proofreading.     , PA-C 12/19/20 1303    02/18/21, MD 12/19/20 [phone]

## 2020-12-19 NOTE — H&P (Addendum)
Date: 12/19/2020               Patient Name:  Gina Henderson MRN: [id number]  DOB: 12/31/1980 Age / Sex: 40 y.o., female   PCP: Patient, No Pcp Per (Inactive)              Medical Service: Internal Medicine Teaching Service              Attending Physician: Dr. Mikey Bussing, Marthenia Rolling, DO    First Contact: Janeal Holmes, MS3 Pager: [phone]  Second Contact: Dr. Karsten Ro Pager: [phone]  Third Contact Dr. Dolan Amen Pager: [phone]       After Hours (After 5p/  First Contact Pager: [phone]  weekends / holidays): Second Contact Pager: [phone]   Chief Complaint: Abnormal blood test  History of Present Illness:  Gina Henderson is a 40 y.o. female who presents to the ED for abnormal labs. On chart review, she visited her PCP today for the first time in 3 years. CBC with differential at her visit showed a hemoglobin of 4.9, and her PCP sent her to the ED.   She states that she did not notice feeling any different today. However, over the last month, she has had more fatigue than normal. However, she attributed her symptoms to staying up late taking care of her twin children, who were born in 2018 at 62 weeks. Her fatigue is not so bad she cannot complete her daily activities. She endorses Tylenol usage for occasional headaches. She does not use any NSAIDs because she had gastric bypass surgery in 2016 for T2DM. She says she has had a lot of complications after the surgery with eating; namely, she tends to eat softer foods because more solid food causes her to have loose stools as well as nausea and vomiting. Her stools recently have been soft and non-bloody. Whenever she has vomited after eating more solid foods, she did not notice any blood.  Patient used to have heavy periods due to her PCOS. However, she has noticed her periods have gotten lighter over time. Her LMP was 35 days ago. She has not traveled outside of [place] recently. She has been around her children who have had ear  infections. She had COVID-19 6 months ago. She denies any recent fever, chills, nausea, vomiting, constipation, diarrhea, hematuria, headache, chest pain, abdominal pain, or weight loss.   ED Course: Labs upon arrival to ED showed severe anemia with no active or brisk bleedings noted. Initiated blood transfusion. Sent FOBT.  Meds: Current Meds  Medication Sig  . acetaminophen (TYLENOL) 500 MG tablet Take 500 mg by mouth every 6 (six) hours as needed for mild pain, moderate pain or headache.  . Melatonin 10 MG TABS Take 20 mg by mouth at bedtime.   Allergies: Allergies as of 12/19/2020  . (No Known Allergies)   PMH: Past Medical History:  Diagnosis Date  . Depression   . Diabetes mellitus without complication (HCC)    off meds since wt loss  . History of unilateral salpingectomy, left   . Hypertension    off meds since wt loss  . Infertility, female    PCOS  . PCOS (polycystic ovarian syndrome)    Family History: Father: diabetes, kidney disease, hypertension, hearing loss Aunt: unknown type of anemia  Social History:  Patient lives in [place] with her husband and twin children. Taking care of her children has contributed to her fatigue. She moved to the [place] from [place] 15 years ago. She  denies any alcohol, tobacco, or other drug use.  Review of Systems: A complete ROS was negative except as per HPI.  Physical Exam: Blood pressure 117/75, pulse (!) 52, temperature 98.1 F (36.7 C), temperature source Oral, resp. rate 16, SpO2 100 %, unknown if currently breastfeeding.  Physical Exam Constitutional:      Appearance: Normal appearance.  HENT:     Head: Normocephalic and atraumatic.     Comments: Pale tongue. Eyes:     Extraocular Movements: Extraocular movements intact.     Comments: Pale conjunctiva.  Cardiovascular:     Rate and Rhythm: Regular rhythm. Bradycardia present.     Pulses: Normal pulses.     Heart sounds: Normal heart sounds.  Pulmonary:     Effort:  Pulmonary effort is normal.     Breath sounds: Normal breath sounds.  Abdominal:     General: Abdomen is flat. Bowel sounds are normal.     Palpations: Abdomen is soft.  Musculoskeletal:        General: Normal range of motion.     Cervical back: Normal range of motion and neck supple.  Skin:    General: Skin is warm and dry.     Capillary Refill: Capillary refill takes less than 2 seconds.     Comments: No purpura or rashes.  Neurological:     General: No focal deficit present.     Mental Status: She is alert and oriented to person, place, and time.  Psychiatric:        Mood and Affect: Mood normal.        Behavior: Behavior normal.        Thought Content: Thought content normal.     Comments: Slightly anxious appearing.    Laboratory Studies: Recent Results (from the past 2160 hour(s))  Type and screen [facility]     Status: None (Preliminary result)   Collection Time: 12/19/20  1:08 PM  Result Value Ref Range   ABO/RH(D) O POS    Antibody Screen NEG    Sample Expiration 12/22/2020,2359    Unit Number [id number]    Blood Component Type RED CELLS,LR    Unit division 00    Status of Unit ISSUED    Transfusion Status OK TO TRANSFUSE    Crossmatch Result Compatible    Unit Number [id number]    Blood Component Type RED CELLS,LR    Unit division 00    Status of Unit ISSUED    Transfusion Status OK TO TRANSFUSE    Crossmatch Result      Compatible Performed at [facility], [address] [place], [place] [address]   BPAM RBC     Status: None (Preliminary result)   Collection Time: 12/19/20  1:08 PM  Result Value Ref Range   ISSUE DATE / TIME [id number]    Blood Product Unit Number [id number]    PRODUCT CODE E0382V00    Unit Type and Rh 5100    Blood Product Expiration Date 517616073710    ISSUE DATE / TIME 626948546270    Blood Product Unit Number [id number]    PRODUCT CODE E0382V00    Unit Type and Rh 5100    Blood Product  Expiration Date 716967893810   Comprehensive metabolic panel     Status: Abnormal   Collection Time: 12/19/20  1:09 PM  Result Value Ref Range   Sodium 137 135 - 145 mmol/L   Potassium 3.8 3.5 - 5.1 mmol/L   Chloride 109  98 - 111 mmol/L   CO2 20 (L) 22 - 32 mmol/L   Glucose, Bld 141 (H) 70 - 99 mg/dL    Comment: Glucose reference range applies only to samples taken after fasting for at least 8 hours.   BUN 9 6 - 20 mg/dL   Creatinine, Ser 1.61 0.44 - 1.00 mg/dL   Calcium 8.6 (L) 8.9 - 10.3 mg/dL   Total Protein 6.7 6.5 - 8.1 g/dL   Albumin 4.1 3.5 - 5.0 g/dL   AST 20 15 - 41 U/L   ALT 13 0 - 44 U/L   Alkaline Phosphatase 55 38 - 126 U/L   Total Bilirubin 0.6 0.3 - 1.2 mg/dL   GFR, Estimated >09 >60 mL/min    Comment: (NOTE) Calculated using the CKD-EPI Creatinine Equation (2021)    Anion gap 8 5 - 15    Comment: Performed at [facility], [address], [place], [place] [address]  CBC with Differential     Status: Abnormal   Collection Time: 12/19/20  1:09 PM  Result Value Ref Range   WBC 3.5 (L) 4.0 - 10.5 K/uL   RBC 3.05 (L) 3.87 - 5.11 MIL/uL   Hemoglobin 4.7 (LL) 12.0 - 15.0 g/dL    Comment: REPEATED TO VERIFY Reticulocyte Hemoglobin testing may be clinically indicated, consider ordering this additional test [id number] THIS CRITICAL RESULT HAS VERIFIED AND BEEN CALLED TO [facility] BERTRAND RN BY CAROL PHILLIPS ON 05 04 2022 AT 1418, AND HAS BEEN READ BACK.     HCT 18.9 (L) 36.0 - 46.0 %   MCV 62.0 (L) 80.0 - 100.0 fL   MCH 15.4 (L) 26.0 - 34.0 pg   MCHC 24.9 (L) 30.0 - 36.0 g/dL   RDW 29.5 (H) 62.1 - 30.8 %   Platelets 104 (L) 150 - 400 K/uL    Comment: Immature Platelet Fraction may be clinically indicated, consider ordering this additional test [id number] REPEATED TO VERIFY PLATELET COUNT CONFIRMED BY SMEAR    nRBC 0.0 0.0 - 0.2 %   Neutrophils Relative % 58 %   Neutro Abs 2.0 1.7 - 7.7 K/uL   Lymphocytes Relative 34 %   Lymphs Abs 1.2 0.7 - 4.0 K/uL    Monocytes Relative 6 %   Monocytes Absolute 0.2 0.1 - 1.0 K/uL   Eosinophils Relative 1 %   Eosinophils Absolute 0.1 0.0 - 0.5 K/uL   Basophils Relative 1 %   Basophils Absolute 0.0 0.0 - 0.1 K/uL   WBC Morphology MORPHOLOGY UNREMARKABLE    Smear Review Normal platelet morphology    Immature Granulocytes 0 %   Abs Immature Granulocytes 0.01 0.00 - 0.07 K/uL   Tear Drop Cells PRESENT    Ovalocytes PRESENT     Comment: Performed at [facility], [address], [place], [place] [address]  I-Stat Beta hCG blood, ED ([facility], [facility], [facility] only)     Status: None   Collection Time: 12/19/20  1:33 PM  Result Value Ref Range   I-stat hCG, quantitative <5.0 <5 mIU/mL   Comment 3            Comment:   GEST. AGE      CONC.  (mIU/mL)   <=1 WEEK        5 - 50     2 WEEKS       50 - 500     3 WEEKS       100 - 10,000  4 WEEKS     1,000 - 30,000        FEMALE AND NON-PREGNANT FEMALE:     LESS THAN 5 mIU/mL   ABO/Rh     Status: None   Collection Time: 12/19/20  2:55 PM  Result Value Ref Range   ABO/RH(D)      O POS Performed at [facility], [address] [place], [place] [address]   Resp Panel by RT-PCR (Flu A&B, Covid) Nasopharyngeal Swab     Status: None   Collection Time: 12/19/20  2:57 PM   Specimen: Nasopharyngeal Swab; Nasopharyngeal(NP) swabs in vial transport medium  Result Value Ref Range   SARS Coronavirus 2 by RT PCR NEGATIVE NEGATIVE    Comment: (NOTE) SARS-CoV-2 target nucleic acids are NOT DETECTED.  The SARS-CoV-2 RNA is generally detectable in upper respiratory specimens during the acute phase of infection. The lowest concentration of SARS-CoV-2 viral copies this assay can detect is 138 copies/mL. A negative result does not preclude SARS-Cov-2 infection and should not be used as the sole basis for treatment or other patient management decisions. A negative result may occur with  improper specimen collection/handling, submission of specimen other than  nasopharyngeal swab, presence of viral mutation(s) within the areas targeted by this assay, and inadequate number of viral copies(<138 copies/mL). A negative result must be combined with clinical observations, patient history, and epidemiological information. The expected result is Negative.  Fact Sheet for Patients:  BloggerCourse.comhttps://www.fda.gov/media/152166/download  Fact Sheet for Healthcare Providers:  SeriousBroker.ithttps://www.fda.gov/media/152162/download  This test is no t yet approved or cleared by the [place] FDA and  has been authorized for detection and/or diagnosis of SARS-CoV-2 by FDA under an Emergency Use Authorization (EUA). This EUA will remain  in effect (meaning this test can be used) for the duration of the COVID-19 declaration under Section 564(b)(1) of the Act, 21 U.S.C.section 360bbb-3(b)(1), unless the authorization is terminated  or revoked sooner.       Influenza A by PCR NEGATIVE NEGATIVE   Influenza B by PCR NEGATIVE NEGATIVE    Comment: (NOTE) The Xpert Xpress SARS-CoV-2/FLU/RSV plus assay is intended as an aid in the diagnosis of influenza from Nasopharyngeal swab specimens and should not be used as a sole basis for treatment. Nasal washings and aspirates are unacceptable for Xpert Xpress SARS-CoV-2/FLU/RSV testing.  Fact Sheet for Patients: BloggerCourse.comhttps://www.fda.gov/media/152166/download  Fact Sheet for Healthcare Providers: SeriousBroker.ithttps://www.fda.gov/media/152162/download  This test is not yet approved or cleared by the [place] FDA and has been authorized for detection and/or diagnosis of SARS-CoV-2 by FDA under an Emergency Use Authorization (EUA). This EUA will remain in effect (meaning this test can be used) for the duration of the COVID-19 declaration under Section 564(b)(1) of the Act, 21 U.S.C. section 360bbb-3(b)(1), unless the authorization is terminated or revoked.  Performed at [facility], [address] [place], [place] [address]   Prepare  RBC (crossmatch)     Status: None   Collection Time: 12/19/20  2:58 PM  Result Value Ref Range   Order Confirmation      ORDER PROCESSED BY BLOOD BANK Performed at [facility], [address] [place], [place] [address]   Occult blood card to lab, stool Provider will collect     Status: None   Collection Time: 12/19/20  4:52 PM  Result Value Ref Range   Fecal Occult Bld NEGATIVE NEGATIVE    Comment: Performed at [facility], [address] [place], [place]  [address]   Assessment & Plan by Problem: Active Problems:   Symptomatic anemia  1. Symptomatic anemia Patient is overall doing well but slightly anxious. She is in no pain and is not obviously bleeding. She is afebrile, pancytopenic, and has microcytic anemia. Poikilocytosis present on blood smear. FOBT negative. No iron studies yet. No heavy menstrual periods or noticeable bloody stools or bloody vomit. Patient did move from [place] 15 years ago. Patient's last two CBCs in 2018 showed Hgb of 8.7 and then 7.4. Differential at this point is IDA vs. Alpha thalassemia vs. Hemolytic syndrome vs. Anemia of chronic disease 2/2 T2DM. -Admit for further evaluation -Iron, TIBC, soluble transferrin saturation, ferritin to assess IDA vs. ACD -If iron studies unremarkable, consider hemoglobin electrophoresis to evaluate a-thalassemia -Serial CBC, CMP -HIV antibody, TSH -Melatonin 10 mg, tylenol 650 mg tablet, senokot tablet prn  2. Type 2 Diabetes Mellitus Patient has a history of T2DM. She is s/p gastric bypass for management of her diabetes. CMP today shows glucose of 141. She has not seen her PCP before today in 3 years, and no recent A1c is on file. -Hgb A1c -Serial CBGs to assess blood glucose throughout admission  Dispo: Admit patient to Observation with expected length of stay less than 2 midnights.  Signed: Samantha Crimes, Medical Student 12/19/2020, 6:32 PM  Pager: [phone] After 5pm on weekdays and 1pm on weekends:  On Call pager [phone]   I have seen and examined the patient myself, and I have reviewed the note by Evette Georges, MS3 and was present during the interview and physical exam.  Signed: Steffanie Rainwater, MD 12/20/2020, 4:52 PM

## 2020-12-19 NOTE — ED Notes (Signed)
Report attempted rn will receive call back

## 2020-12-19 NOTE — ED Triage Notes (Signed)
Pt from her PCP, reports feeling weak x 1 year and was told today her Hgb is 4.9. Denies obvious sources of bleeding.

## 2020-12-19 NOTE — ED Provider Notes (Signed)
[facility] EMERGENCY DEPARTMENT Provider Note   CSN: [id number] Arrival date & time: 12/19/20  1233     History Chief Complaint  Patient presents with  . Abnormal Lab    Gina Henderson is a 40 y.o. female.  Pt complains of low hemoglobin.  Patient went to the doctor today and was noted to have low hemoglobin was sent to the emergency department for further evaluation.  She states she has been tired and fatigued lately.  In terms of worsened over the course of the last several weeks.  She saw primary care doctor for the first time today, had lab work done and was told to go to the ER due to severe anemia.  She denies any active bleeding that is known to her.  Denies any blood in the stool or abnormal periods.  Denies fevers vomiting cough diarrhea no headache no chest pain abdominal pain.  She does not use Motrin or Advil or Aleve or aspirin.  She does use Tylenol occasionally.        Past Medical History:  Diagnosis Date  . Depression   . Diabetes mellitus without complication (HCC)    off meds since wt loss  . History of unilateral salpingectomy, left   . Hypertension    off meds since wt loss  . Infertility, female    PCOS  . PCOS (polycystic ovarian syndrome)     Patient Active Problem List   Diagnosis Date Noted  . 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 Abnormal thyroid blood test 04/03/2017  . [redacted] weeks gestation of pregnancy   . Pregnancy resulting from assisted reproductive technology 03/09/2017  . AMA (advanced maternal age) primigravida 35+, second trimester 03/09/2017  . PCOS (polycystic ovarian syndrome) 03/09/2017  . Chronic hypertension during pregnancy, antepartum 03/09/2017  . Pregnancy complicated by previous gastric bypass, antepartum 03/09/2017  . Twin pregnancy 03/09/2017  . Encounter for supervision of high risk primigravida of advanced maternal age 85/23/2018  . Pregestational diabetes mellitus, modified White class B 03/09/2017     Past Surgical History:  Procedure Laterality Date  . APPENDECTOMY    . BREAST SURGERY    . GASTRIC BYPASS  2016     OB History    Gravida  1   Para      Term  0   Preterm  0   AB  0   Living  0     SAB  0   IAB      Ectopic      Multiple      Live Births              Family History  Problem Relation Age of Onset  . Diabetes Father   . Kidney disease Father   . Hypertension Father   . Hearing loss Father     Social History   Tobacco Use  . Smoking status: Former Smoker    Types: Cigarettes  . Smokeless tobacco: Never Used  . Tobacco comment: quit 2016  Substance Use Topics  . Alcohol use: No  . Drug use: No    Home Medications Prior to Admission medications   Medication Sig Start Date End Date Taking? Authorizing Provider  acetaminophen (TYLENOL) 500 MG tablet Take 500 mg by mouth every 6 (six) hours as needed for mild pain, moderate pain or headache.    [provider]  hydrocortisone (ANUSOL-HC) 2.5 % rectal cream Place 1 application rectally 4 (four) times daily as needed for  hemorrhoids or anal itching.    [provider]  Prenatal Vit-Fe Fumarate-FA (PRENATAL MULTIVITAMIN) TABS tablet Take 1 tablet by mouth at bedtime.    [provider]  progesterone (PROMETRIUM) 200 MG capsule Place one capsule vaginally at bedtime 05/12/17   Levie Heritage, DO    Allergies    Patient has no known allergies.  Review of Systems   Review of Systems  Constitutional: Negative for fever.  HENT: Negative for ear pain.   Eyes: Negative for pain.  Respiratory: Negative for cough.   Cardiovascular: Negative for chest pain.  Gastrointestinal: Negative for abdominal pain.  Genitourinary: Negative for flank pain.  Musculoskeletal: Negative for back pain.  Skin: Negative for rash.  Neurological: Negative for headaches.    Physical Exam Updated Vital Signs BP 119/78 (BP Location: Right Arm)   Pulse 71   Temp 98.2 F (36.8  C) (Oral)   Resp 17   SpO2 100%   Physical Exam Constitutional:      General: She is not in acute distress.    Appearance: Normal appearance.  HENT:     Head: Normocephalic.     Nose: Nose normal.  Eyes:     Extraocular Movements: Extraocular movements intact.     Comments: Pale conjunctiva  Cardiovascular:     Rate and Rhythm: Normal rate.  Pulmonary:     Effort: Pulmonary effort is normal.  Genitourinary:    Comments: Rectal exam done with nursing chaperone present.  Normal-appearing stool. Musculoskeletal:        General: Normal range of motion.     Cervical back: Normal range of motion.  Skin:    Coloration: Skin is pale.  Neurological:     General: No focal deficit present.     Mental Status: She is alert. Mental status is at baseline.     ED Results / Procedures / Treatments   Labs (all labs ordered are listed, but only abnormal results are displayed) Labs Reviewed  COMPREHENSIVE METABOLIC PANEL - Abnormal; Notable for the following components:      Result Value   CO2 20 (*)    Glucose, Bld 141 (*)    Calcium 8.6 (*)    All other components within normal limits  CBC WITH DIFFERENTIAL/PLATELET - Abnormal; Notable for the following components:   WBC 3.5 (*)    RBC 3.05 (*)    Hemoglobin 4.7 (*)    HCT 18.9 (*)    MCV 62.0 (*)    MCH 15.4 (*)    MCHC 24.9 (*)    RDW 20.8 (*)    Platelets 104 (*)    All other components within normal limits  RESP PANEL BY RT-PCR (FLU A&B, COVID) ARPGX2  I-STAT BETA HCG BLOOD, ED ([facility], [facility], [facility] ONLY)  POC OCCULT BLOOD, ED  TYPE AND SCREEN  ABO/RH  PREPARE RBC (CROSSMATCH)    EKG None  Radiology No results found.  Procedures .Critical Care Performed by: Cheryll Cockayne, MD Authorized by: Cheryll Cockayne, MD   Critical care provider statement:    Critical care time (minutes):  30   Critical care time was exclusive of:  Separately billable procedures and treating other patients   Critical care was necessary to treat  or prevent imminent or life-threatening deterioration of the following conditions:  Circulatory failure     Medications Ordered in ED Medications  0.9 %  sodium chloride infusion (has no administration in time range)    ED Course  I  have reviewed the triage vital signs and the nursing notes.  Pertinent labs & imaging results that were available during my care of the patient were reviewed by me and considered in my medical decision making (see chart for details).    MDM Rules/Calculators/A&P                          Labs are sent.  Patient found to be severely anemic.  No active or brisk bleeding seen at this time.  Stool sent for FOBT exam.  Patient consented for blood transfusion.  Will be brought into the hospitalist team for severe anemia   Final Clinical Impression(s) / ED Diagnoses Final diagnoses:  Severe anemia    Rx / DC Orders ED Discharge Orders    None       Cheryll Cockayne, MD 12/19/20 1458

## 2020-12-20 DIAGNOSIS — E119 Type 2 diabetes mellitus without complications: Secondary | ICD-10-CM | POA: Diagnosis not present

## 2020-12-20 DIAGNOSIS — D649 Anemia, unspecified: Secondary | ICD-10-CM | POA: Diagnosis not present

## 2020-12-20 DIAGNOSIS — D509 Iron deficiency anemia, unspecified: Secondary | ICD-10-CM

## 2020-12-20 LAB — CBC
HCT: 24.9 % — ABNORMAL LOW (ref 36.0–46.0)
Hemoglobin: 7 g/dL — ABNORMAL LOW (ref 12.0–15.0)
MCH: 18.8 pg — ABNORMAL LOW (ref 26.0–34.0)
MCHC: 28.1 g/dL — ABNORMAL LOW (ref 30.0–36.0)
MCV: 66.9 fL — ABNORMAL LOW (ref 80.0–100.0)
Platelets: 92 10*3/uL — ABNORMAL LOW (ref 150–400)
RBC: 3.72 MIL/uL — ABNORMAL LOW (ref 3.87–5.11)
RDW: 27.1 % — ABNORMAL HIGH (ref 11.5–15.5)
WBC: 5.5 10*3/uL (ref 4.0–10.5)
nRBC: 0 % (ref 0.0–0.2)

## 2020-12-20 LAB — BPAM RBC
Blood Product Expiration Date: 202205102359
Blood Product Expiration Date: 202205102359
ISSUE DATE / TIME: 202205041523
ISSUE DATE / TIME: 202205041755
Unit Type and Rh: 5100
Unit Type and Rh: 5100

## 2020-12-20 LAB — TYPE AND SCREEN
ABO/RH(D): O POS
Antibody Screen: NEGATIVE
Unit division: 0
Unit division: 0

## 2020-12-20 LAB — RETICULOCYTES
RBC.: 3.68 MIL/uL — ABNORMAL LOW (ref 3.87–5.11)
Retic Ct Pct: <0.4 % — ABNORMAL LOW (ref 0.4–3.1)

## 2020-12-20 LAB — COMPREHENSIVE METABOLIC PANEL
ALT: 12 U/L (ref 0–44)
AST: 15 U/L (ref 15–41)
Albumin: 3.6 g/dL (ref 3.5–5.0)
Alkaline Phosphatase: 50 U/L (ref 38–126)
Anion gap: 9 (ref 5–15)
BUN: 9 mg/dL (ref 6–20)
CO2: 24 mmol/L (ref 22–32)
Calcium: 8.7 mg/dL — ABNORMAL LOW (ref 8.9–10.3)
Chloride: 107 mmol/L (ref 98–111)
Creatinine, Ser: 0.59 mg/dL (ref 0.44–1.00)
GFR, Estimated: >60 mL/min (ref >60)
Glucose, Bld: 81 mg/dL (ref 70–99)
Potassium: 3.7 mmol/L (ref 3.5–5.1)
Sodium: 140 mmol/L (ref 135–145)
Total Bilirubin: 1.1 mg/dL (ref 0.3–1.2)
Total Protein: 6.1 g/dL — ABNORMAL LOW (ref 6.5–8.1)

## 2020-12-20 MED ORDER — FERROUS SULFATE 325 (65 FE) MG PO TABS: 325.0000 mg | ORAL_TABLET | ORAL | 0 refills | Status: AC

## 2020-12-20 MED ORDER — SODIUM CHLORIDE 0.9 % IV SOLN
25.0000 mg | Freq: Once | INTRAVENOUS | Status: AC
  Administered 2020-12-20: 25 mg via INTRAVENOUS
  Filled 2020-12-20: qty 0.5

## 2020-12-20 MED ORDER — NALOXONE HCL 0.4 MG/ML IJ SOLN
INTRAMUSCULAR | Status: AC
  Filled 2020-12-20: qty 1

## 2020-12-20 MED ORDER — SODIUM CHLORIDE 0.9 % IV SOLN
1000.0000 mg | Freq: Once | INTRAVENOUS | Status: AC
  Administered 2020-12-20: 1000 mg via INTRAVENOUS
  Filled 2020-12-20: qty 20

## 2020-12-20 NOTE — Progress Notes (Signed)
DISCHARGE NOTE HOME Tressia Perman to be discharged to home per MD order. Discussed prescriptions and follow up appointments with the patient. Prescriptions given to patient; medication list explained in detail. Patient verbalized understanding.  Skin clean, dry and intact without evidence of skin break down, no evidence of skin tears noted. IV catheter discontinued intact. Site without signs and symptoms of complications. Dressing and pressure applied. Pt denies pain at the site currently. No complaints noted.  Patient free of lines, drains, and wounds.   An After Visit Summary (AVS) was printed and given to the patient. Patient escorted via wheelchair, and discharged home via private auto at 1900.  [place], Kem Kays, RN

## 2020-12-20 NOTE — Progress Notes (Signed)
New Admission Note:  Arrival Method: from ER Mental Orientation: A & O x4 Telemetry:n/a Assessment: Completed Skin:intact IV: R AC Pain:0/10 Safety Measures: Safety Fall Prevention Plan was given, discussed. 5M21: Patient has been orientated to the room, unit and the staff. Family:patient updated spouse  Orders have been reviewed and implemented. Will continue to monitor the patient. Call light has been placed within reach and bed alarm has been activated.   Lawernce Ion ,RN

## 2020-12-20 NOTE — Discharge Summary (Addendum)
Name: Gina Henderson MRN: [id number] DOB: 29-Mar-1981 40 y.o. PCP: Patient, No Pcp Per (Inactive)  Date of Admission: 12/19/2020 12:59 PM Date of Discharge:  12/20/2020 Attending Physician: Gust Rung, DO   Discharge Diagnosis: 1. Iron Deficiency Anemia 2. Type 2 DM  Discharge Medications: Allergies as of 12/20/2020   No Known Allergies     Medication List    TAKE these medications   acetaminophen 500 MG tablet Commonly known as: TYLENOL Take 500 mg by mouth every 6 (six) hours as needed for mild pain, moderate pain or headache.   ferrous sulfate 325 (65 FE) MG tablet Take 1 tablet (325 mg total) by mouth every other day.   Melatonin 10 MG Tabs Take 20 mg by mouth at bedtime.   progesterone 200 MG capsule Commonly known as: PROMETRIUM Place one capsule vaginally at bedtime   traZODone 50 MG tablet Commonly known as: DESYREL Take 50 mg by mouth at bedtime.       Disposition and follow-up:   Ms.Zaryiah Gashi was discharged from [facility] in Stable condition.  At the hospital follow up visit please address:  1.  Follow up: Marland Kitchen Follow up with PCP in 2 weeks.   2.  Labs / imaging needed at time of follow-up: CBC  3.  Pending labs/ test needing follow-up: None  Follow-up Appointments:   Hospital Course by problem list: Gina Henderson is a 40 y.o. female who presents to the ED for abnormal labs. She visited her PCP for the first time in 3 years. CBC with differential at her visit showed a hemoglobin of 4.9, and her PCP sent her to the ED.   Iron Deficiency Anemia  Patient complaints of fatigue, denies any blood in the stools or vomit.  History of heavy periods due to PCOS.  No heavy menstrual periods now.  Patient vitals were stable except bradycardia with pulse rate of 52/min.  Patient has history of gastric bypass surgery from [place] due to T2DM. CBC showed low hemoglobin of 4.7, MCV 62, MCH 15.4.  FOBT negative.CMP within normal limits,   TSH-1.003, HIV negative. COVID and influenza negative. Iron studies were done which were consistent with iron deficiency anemia. Patient received 2 units of PRBC's, repeat hemoglobin was 7.8 and 7.0. Pt was given IV Iron Dextran 1000 mg after 25 mg of test dose.  Patient iron deficiency anemia likely due to decreased absorption of iron due to gastric bypass surgery and history of heavy menstrual bleeding. Pt tolerated it well and discharged in stable condition. Pt recommended to take OTC Ferrous sulphate 325 mg every other day. Pt will f/u with her PCP and Gynecologist.       Type 2 diabetes mellitus Patient has a history of T2DM. She is s/p gastric bypass for management of her diabetes. HbA1c 5.8, blood glucose at admission was 141 and blood glucose on the day of discharge was 81.  Recommended to monitor HbA1c on annual visit with PCP.  Subjective on day of discharge: Patient denies any complaints.  Patient relieved that it is not due to any serious problem.  Denies chest pain, shortness of breath, bleeding in stool or vomit.   Discharge Exam:   BP 113/70 (BP Location: Right Arm)   Pulse (!) 56   Temp 98.4 F (36.9 C) (Oral)   Resp 16   SpO2 98%  Discharge exam: Physical Exam Vitals and nursing note reviewed.  Constitutional:      General: She is not in  acute distress.    Appearance: Normal appearance. She is normal weight. She is not ill-appearing, toxic-appearing or diaphoretic.  HENT:     Head: Normocephalic and atraumatic.     Mouth/Throat:     Mouth: Mucous membranes are moist.     Comments: Pale toungue Eyes:     Comments: Conjunctiva pale.  Cardiovascular:     Rate and Rhythm: Regular rhythm. Bradycardia present.     Pulses: Normal pulses.  Pulmonary:     Effort: Pulmonary effort is normal.  Abdominal:     General: Abdomen is flat.     Palpations: Abdomen is soft.  Musculoskeletal:     Cervical back: Normal range of motion.  Skin:    General: Skin is warm and dry.   Neurological:     General: No focal deficit present.     Mental Status: She is alert and oriented to person, place, and time.  Psychiatric:        Mood and Affect: Mood normal.        Behavior: Behavior normal.        Thought Content: Thought content normal.     Pertinent Labs, Studies, and Procedures:  No results found.    Discharge Instructions: Discharge Instructions    Diet - low sodium heart healthy   Complete by: As directed    Discharge instructions   Complete by: As directed    Ms. Ringer,   It was a pleasure taking care of you here in the hospital.  You were admitted because of anemia.  You received blood and IV iron.  At discharge, please purchase iron supplements/ferrous sulfate over-the-counter and take 1 pill every other day.  Please follow-up with your primary care doctor.  Take care!   Increase activity slowly   Complete by: As directed       Signed: Karsten Ro, MD 12/20/2020, 2:15 PM   Pager: [phone]

## 2020-12-20 NOTE — Progress Notes (Signed)
No allergic reaction from Infed test dose. Pharmacy made aware.

## 2020-12-23 LAB — SOLUBLE TRANSFERRIN RECEPTOR: Transferrin Receptor: 104.7 nmol/L — ABNORMAL HIGH (ref 12.2–27.3)
